# Patient Record
Sex: Male | Born: 1965 | Race: White | Hispanic: No | Marital: Single | Smoking: Former smoker
Health system: Southern US, Community
[De-identification: ages and names within clinical notes are randomized; demographics above are authoritative.]

## PROBLEM LIST (undated history)

## (undated) ENCOUNTER — Emergency Department (HOSPITAL_COMMUNITY): Admission: EM | Payer: Self-pay | Source: Home / Self Care

## (undated) DIAGNOSIS — E119 Type 2 diabetes mellitus without complications: Secondary | ICD-10-CM

---

## 2015-09-22 DIAGNOSIS — F3342 Major depressive disorder, recurrent, in full remission: Secondary | ICD-10-CM

## 2015-09-22 DIAGNOSIS — E782 Mixed hyperlipidemia: Secondary | ICD-10-CM

## 2016-01-14 DIAGNOSIS — N529 Male erectile dysfunction, unspecified: Secondary | ICD-10-CM | POA: Insufficient documentation

## 2017-07-13 DIAGNOSIS — E876 Hypokalemia: Secondary | ICD-10-CM

## 2017-07-13 DIAGNOSIS — K859 Acute pancreatitis without necrosis or infection, unspecified: Secondary | ICD-10-CM

## 2017-07-13 DIAGNOSIS — E1311 Other specified diabetes mellitus with ketoacidosis with coma: Secondary | ICD-10-CM

## 2017-07-13 DIAGNOSIS — N179 Acute kidney failure, unspecified: Secondary | ICD-10-CM

## 2017-07-13 DIAGNOSIS — R748 Abnormal levels of other serum enzymes: Secondary | ICD-10-CM

## 2017-07-13 DIAGNOSIS — R6511 Systemic inflammatory response syndrome (SIRS) of non-infectious origin with acute organ dysfunction: Secondary | ICD-10-CM

## 2017-07-13 DIAGNOSIS — G9341 Metabolic encephalopathy: Secondary | ICD-10-CM

## 2017-07-13 DIAGNOSIS — I10 Essential (primary) hypertension: Secondary | ICD-10-CM

## 2018-06-21 ENCOUNTER — Other Ambulatory Visit: Payer: Self-pay

## 2018-06-21 ENCOUNTER — Emergency Department (HOSPITAL_COMMUNITY)
Admission: EM | Admit: 2018-06-21 | Discharge: 2018-06-22 | Disposition: A | Discharge status: Home / Self Care | Payer: No Typology Code available for payment source | Attending: Emergency Medicine | Admitting: Emergency Medicine

## 2018-06-21 ENCOUNTER — Emergency Department (HOSPITAL_COMMUNITY): Payer: No Typology Code available for payment source

## 2018-06-21 ENCOUNTER — Encounter (HOSPITAL_COMMUNITY): Payer: Self-pay | Admitting: Emergency Medicine

## 2018-06-21 DIAGNOSIS — Y990 Civilian activity done for income or pay: Secondary | ICD-10-CM | POA: Insufficient documentation

## 2018-06-21 DIAGNOSIS — Y929 Unspecified place or not applicable: Secondary | ICD-10-CM | POA: Diagnosis not present

## 2018-06-21 DIAGNOSIS — W503XXA Accidental bite by another person, initial encounter: Secondary | ICD-10-CM

## 2018-06-21 DIAGNOSIS — S62667A Nondisplaced fracture of distal phalanx of left little finger, initial encounter for closed fracture: Secondary | ICD-10-CM | POA: Diagnosis present

## 2018-06-21 DIAGNOSIS — E119 Type 2 diabetes mellitus without complications: Secondary | ICD-10-CM | POA: Insufficient documentation

## 2018-06-21 DIAGNOSIS — Y939 Activity, unspecified: Secondary | ICD-10-CM | POA: Diagnosis not present

## 2018-06-21 DIAGNOSIS — Z23 Encounter for immunization: Secondary | ICD-10-CM | POA: Insufficient documentation

## 2018-06-21 DIAGNOSIS — S6992XA Unspecified injury of left wrist, hand and finger(s), initial encounter: Secondary | ICD-10-CM

## 2018-06-21 HISTORY — DX: Type 2 diabetes mellitus without complications: E11.9

## 2018-06-21 LAB — CBG MONITORING, ED: Glucose-Capillary: 80 mg/dL (ref 70–99)

## 2018-06-21 MED ORDER — IBUPROFEN 400 MG PO TABS
600.0000 mg | ORAL_TABLET | Freq: Once | ORAL | Status: AC
  Administered 2018-06-21: 600 mg via ORAL
  Filled 2018-06-21: qty 1

## 2018-06-21 MED ORDER — AMOXICILLIN-POT CLAVULANATE 875-125 MG PO TABS
1.0000 | ORAL_TABLET | Freq: Once | ORAL | Status: AC
  Administered 2018-06-21: 1 via ORAL
  Filled 2018-06-21: qty 1

## 2018-06-21 MED ORDER — TETANUS-DIPHTH-ACELL PERTUSSIS 5-2.5-18.5 LF-MCG/0.5 IM SUSP
0.5000 mL | Freq: Once | INTRAMUSCULAR | Status: AC
  Administered 2018-06-21: 0.5 mL via INTRAMUSCULAR
  Filled 2018-06-21: qty 0.5
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
## 2018-06-22 LAB — COMPREHENSIVE METABOLIC PANEL
ALT: 18 U/L (ref 0–44)
AST: 17 U/L (ref 15–41)
Albumin: 4.1 g/dL (ref 3.5–5.0)
Alkaline Phosphatase: 72 U/L (ref 38–126)
Anion gap: 9 (ref 5–15)
BILIRUBIN TOTAL: 1.2 mg/dL (ref 0.3–1.2)
BUN: 11 mg/dL (ref 6–20)
CO2: 29 mmol/L (ref 22–32)
Calcium: 8.9 mg/dL (ref 8.9–10.3)
Chloride: 100 mmol/L (ref 98–111)
Creatinine, Ser: 0.83 mg/dL (ref 0.61–1.24)
GFR calc Af Amer: >60 mL/min (ref >60)
Glucose, Bld: 162 mg/dL — ABNORMAL HIGH (ref 70–99)
Potassium: 3.3 mmol/L — ABNORMAL LOW (ref 3.5–5.1)
Sodium: 138 mmol/L (ref 135–145)
TOTAL PROTEIN: 6.8 g/dL (ref 6.5–8.1)

## 2018-06-22 LAB — RAPID HIV SCREEN (HIV 1/2 AB+AG)
HIV 1/2 Antibodies: NONREACTIVE
HIV-1 P24 ANTIGEN - HIV24: NONREACTIVE

## 2018-06-22 MED ORDER — EMTRICITABINE-TENOFOVIR DF 200-300 MG PO TABS: 1.0000 | ORAL_TABLET | Freq: Every day | ORAL | 0 refills | Status: DC

## 2018-06-22 MED ORDER — LIDOCAINE HCL (PF) 1 % IJ SOLN
30.0000 mL | Freq: Once | INTRAMUSCULAR | Status: AC
  Administered 2018-06-22: 30 mL
  Filled 2018-06-22: qty 30

## 2018-06-22 MED ORDER — DOLUTEGRAVIR SODIUM 50 MG PO TABS: 50.0000 mg | ORAL_TABLET | Freq: Every day | ORAL | 0 refills | Status: DC

## 2018-06-22 MED ORDER — IBUPROFEN 600 MG PO TABS: 600.0000 mg | ORAL_TABLET | Freq: Four times a day (QID) | ORAL | 0 refills | Status: DC | PRN

## 2018-06-22 MED ORDER — AMOXICILLIN-POT CLAVULANATE 875-125 MG PO TABS: 1.0000 | ORAL_TABLET | Freq: Two times a day (BID) | ORAL | 0 refills | Status: AC

## 2018-06-22 NOTE — Discharge Instructions (Addendum)
Take all medications as prescribed. If your source of exposure tests negative, you can stop the Tivicay and Truvada. Continue the Augmentin until completed. Call Dr. Shelba FlakeLandau's office to schedule a recheck appointment for early next week. Return here as needed.

## 2018-06-22 NOTE — ED Notes (Signed)
Re-paged Hand Surg. to Yahoo! IncSheri Upstill PA

## 2018-06-23 LAB — HEPATITIS B SURFACE ANTIGEN: Hepatitis B Surface Ag: NEGATIVE

## 2018-11-27 ENCOUNTER — Other Ambulatory Visit: Payer: Self-pay | Admitting: Psychiatric/Mental Health

## 2018-11-27 ENCOUNTER — Other Ambulatory Visit: Payer: Self-pay

## 2018-11-27 ENCOUNTER — Encounter (HOSPITAL_COMMUNITY): Payer: Self-pay

## 2018-11-27 ENCOUNTER — Inpatient Hospital Stay (HOSPITAL_COMMUNITY)
Admission: AD | Admit: 2018-11-27 | Discharge: 2018-11-30 | DRG: 881 | Disposition: A | Discharge status: Home / Self Care | Payer: No Typology Code available for payment source | Attending: Psychiatry | Admitting: Psychiatry

## 2018-11-27 DIAGNOSIS — E1165 Type 2 diabetes mellitus with hyperglycemia: Secondary | ICD-10-CM | POA: Diagnosis present

## 2018-11-27 DIAGNOSIS — F1020 Alcohol dependence, uncomplicated: Secondary | ICD-10-CM | POA: Diagnosis present

## 2018-11-27 DIAGNOSIS — G4700 Insomnia, unspecified: Secondary | ICD-10-CM | POA: Diagnosis present

## 2018-11-27 DIAGNOSIS — F322 Major depressive disorder, single episode, severe without psychotic features: Secondary | ICD-10-CM

## 2018-11-27 DIAGNOSIS — F17200 Nicotine dependence, unspecified, uncomplicated: Secondary | ICD-10-CM | POA: Diagnosis present

## 2018-11-27 DIAGNOSIS — F10229 Alcohol dependence with intoxication, unspecified: Secondary | ICD-10-CM | POA: Diagnosis present

## 2018-11-27 DIAGNOSIS — Z794 Long term (current) use of insulin: Secondary | ICD-10-CM | POA: Diagnosis not present

## 2018-11-27 DIAGNOSIS — F329 Major depressive disorder, single episode, unspecified: Principal | ICD-10-CM | POA: Diagnosis present

## 2018-11-27 DIAGNOSIS — F419 Anxiety disorder, unspecified: Secondary | ICD-10-CM | POA: Diagnosis present

## 2018-11-27 HISTORY — DX: Major depressive disorder, single episode, severe without psychotic features: F32.2

## 2018-11-27 LAB — GLUCOSE, CAPILLARY
Glucose-Capillary: 160 mg/dL — ABNORMAL HIGH (ref 70–99)
Glucose-Capillary: 228 mg/dL — ABNORMAL HIGH (ref 70–99)

## 2018-11-27 MED ORDER — INSULIN ASPART 100 UNIT/ML ~~LOC~~ SOLN
0.0000 [IU] | Freq: Three times a day (TID) | SUBCUTANEOUS | Status: DC
  Administered 2018-11-28: 3 [IU] via SUBCUTANEOUS
  Administered 2018-11-28: 1 [IU] via SUBCUTANEOUS
  Administered 2018-11-28: 7 [IU] via SUBCUTANEOUS
  Administered 2018-11-29: 3 [IU] via SUBCUTANEOUS
  Administered 2018-11-29: 9 [IU] via SUBCUTANEOUS
  Administered 2018-11-30: 1 [IU] via SUBCUTANEOUS
  Administered 2018-11-30: 2 [IU] via SUBCUTANEOUS

## 2018-11-27 MED ORDER — METFORMIN HCL 500 MG PO TABS
1000.0000 mg | ORAL_TABLET | Freq: Two times a day (BID) | ORAL | Status: DC
  Administered 2018-11-27 – 2018-11-30 (×5): 1000 mg via ORAL
  Filled 2018-11-27 (×9): qty 2
  Filled 2018-11-27: qty 28
  Filled 2018-11-27 (×2): qty 2
  Filled 2018-11-27: qty 28
  Filled 2018-11-27: qty 2

## 2018-11-27 MED ORDER — CHLORDIAZEPOXIDE HCL 25 MG PO CAPS
25.0000 mg | ORAL_CAPSULE | Freq: Four times a day (QID) | ORAL | Status: DC | PRN
  Administered 2018-11-28: 25 mg via ORAL
  Filled 2018-11-27: qty 1

## 2018-11-27 MED ORDER — INSULIN GLARGINE 100 UNIT/ML ~~LOC~~ SOLN
25.0000 [IU] | Freq: Every day | SUBCUTANEOUS | Status: DC
  Administered 2018-11-27 – 2018-11-28 (×2): 25 [IU] via SUBCUTANEOUS

## 2018-11-27 NOTE — Progress Notes (Signed)
Patient did not attend wrap up group. 

## 2018-11-27 NOTE — Tx Team (Signed)
Initial Treatment Plan 11/27/2018 5:54 PM [name] NMM:[id number]    PATIENT STRESSORS: Financial difficulties Health problems Legal issue Substance abuse   PATIENT STRENGTHS: Ability for insight Capable of independent living Communication skills   PATIENT IDENTIFIED PROBLEMS: "get rid of my depression"  "get job back"  Depression  Suicidal Ideation   Alcohol Abuse             DISCHARGE CRITERIA:  Ability to meet basic life and health needs Adequate post-discharge living arrangements Motivation to continue treatment in a less acute level of care  PRELIMINARY DISCHARGE PLAN: Attend aftercare/continuing care group Attend 12-step recovery group Outpatient therapy  PATIENT/FAMILY INVOLVEMENT: This treatment plan has been presented to and reviewed with the patient, Kenneth Herrera, and/or family member.  The patient and family have been given the opportunity to ask questions and make suggestions.  Clarene Critchley, RN 11/27/2018, 5:54 PM
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
## 2018-11-27 NOTE — Progress Notes (Signed)
Patient ID: Kenneth Herrera, male   DOB: 11-07-1965, 53 y.o.   MRN: [id number]   D: Patient lying in bed on approach tonight. Reports he is just feeling tired. No active SI tonight. Denies having any withdrawal symptoms at this time but does have Librium available if needed. CBG's will be assessed AC & HS. A: Staff will monitor on q 15 minute checks, follow treatment plan, and give medications as ordered. R: Cooperative tonight but very sleepy.

## 2018-11-27 NOTE — Progress Notes (Signed)
Admission Note: Patient is a 53 year old male admitted to the unit under IVC for making suicidal threat and drinking all day.  Patient currently denies suicidal ideation while in the hospital.  Patient appears disorganized and intoxicated.  Speech is slurred with unsteady gait.  States goals for hospitalization is to get rid of depression and to get his job back.  Admission plan of care reviewed and consent signed.  Skin assessment completed.  Skin is dry and intact.  No contraband found.  Patient oriented to the unit, staff and room.  Routine safety checks initiated.  Support and encouragement offered.  Patient is safe on the unit.

## 2018-11-28 DIAGNOSIS — F322 Major depressive disorder, single episode, severe without psychotic features: Secondary | ICD-10-CM

## 2018-11-28 DIAGNOSIS — F1020 Alcohol dependence, uncomplicated: Secondary | ICD-10-CM

## 2018-11-28 HISTORY — DX: Alcohol dependence, uncomplicated: F10.20

## 2018-11-28 LAB — GLUCOSE, CAPILLARY
Glucose-Capillary: 146 mg/dL — ABNORMAL HIGH (ref 70–99)
Glucose-Capillary: 216 mg/dL — ABNORMAL HIGH (ref 70–99)
Glucose-Capillary: 344 mg/dL — ABNORMAL HIGH (ref 70–99)
Glucose-Capillary: 326 mg/dL — ABNORMAL HIGH (ref 70–99)

## 2018-11-28 LAB — HEMOGLOBIN A1C
Hgb A1c MFr Bld: 11.8 % — ABNORMAL HIGH (ref 4.8–5.6)
Mean Plasma Glucose: 291.96 mg/dL

## 2018-11-28 MED ORDER — GABAPENTIN 300 MG PO CAPS
300.0000 mg | ORAL_CAPSULE | Freq: Three times a day (TID) | ORAL | Status: DC
  Administered 2018-11-28 – 2018-11-30 (×8): 300 mg via ORAL
  Filled 2018-11-28 (×5): qty 1
  Filled 2018-11-28 (×2): qty 42
  Filled 2018-11-28 (×5): qty 1
  Filled 2018-11-28: qty 42
  Filled 2018-11-28 (×3): qty 1

## 2018-11-28 MED ORDER — VITAMIN B-1 100 MG PO TABS
100.0000 mg | ORAL_TABLET | Freq: Every day | ORAL | Status: DC
  Administered 2018-11-28 – 2018-11-30 (×3): 100 mg via ORAL
  Filled 2018-11-28 (×8): qty 1

## 2018-11-28 MED ORDER — FOLIC ACID 1 MG PO TABS
1.0000 mg | ORAL_TABLET | Freq: Every day | ORAL | Status: DC
  Administered 2018-11-28 – 2018-11-30 (×3): 1 mg via ORAL
  Filled 2018-11-28 (×7): qty 1

## 2018-11-28 MED ORDER — ALUM & MAG HYDROXIDE-SIMETH 200-200-20 MG/5ML PO SUSP: 30.0000 mL | ORAL | Status: DC | PRN

## 2018-11-28 MED ORDER — ACETAMINOPHEN 325 MG PO TABS: 650.0000 mg | ORAL_TABLET | Freq: Four times a day (QID) | ORAL | Status: DC | PRN

## 2018-11-28 MED ORDER — HYDROXYZINE HCL 25 MG PO TABS: 25.0000 mg | ORAL_TABLET | Freq: Three times a day (TID) | ORAL | Status: DC | PRN

## 2018-11-28 MED ORDER — MAGNESIUM HYDROXIDE 400 MG/5ML PO SUSP: 30.0000 mL | Freq: Every day | ORAL | Status: DC | PRN

## 2018-11-28 MED ORDER — TRAZODONE HCL 50 MG PO TABS
50.0000 mg | ORAL_TABLET | Freq: Every evening | ORAL | Status: DC | PRN
  Administered 2018-11-28 – 2018-11-29 (×2): 50 mg via ORAL
  Filled 2018-11-28 (×2): qty 1
  Filled 2018-11-28: qty 14

## 2018-11-28 MED ORDER — NICOTINE 21 MG/24HR TD PT24
21.0000 mg | MEDICATED_PATCH | Freq: Every day | TRANSDERMAL | Status: DC
  Administered 2018-11-28: 21 mg via TRANSDERMAL
  Filled 2018-11-28 (×6): qty 1
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
## 2018-11-28 NOTE — Progress Notes (Signed)
Patient ID: Kenneth Herrera, male   DOB: 05/04/1966, 53 y.o.   MRN: [id number]  [place] NOVEL CORONAVIRUS (COVID-19) DAILY CHECK-OFF SYMPTOMS - answer yes or no to each - every day NO YES  Have you had a fever in the past 24 hours?  . Fever (Temp > 37.80C / 100F) X   Have you had any of these symptoms in the past 24 hours? . New Cough .  Sore Throat  .  Shortness of Breath .  Difficulty Breathing .  Unexplained Body Aches   X   Have you had any one of these symptoms in the past 24 hours not related to allergies?   . Runny Nose .  Nasal Congestion .  Sneezing   X   If you have had runny nose, nasal congestion, sneezing in the past 24 hours, has it worsened?  X   EXPOSURES - check yes or no X   Have you traveled outside the state in the past 14 days?  X   Have you been in contact with someone with a confirmed diagnosis of COVID-19 or PUI in the past 14 days without wearing appropriate PPE?  X   Have you been living in the same home as a person with confirmed diagnosis of COVID-19 or a PUI (household contact)?    X   Have you been diagnosed with COVID-19?    X              What to do next: Answered NO to all: Answered YES to anything:   Proceed with unit schedule Follow the [facility] Inpatient Flowsheet.

## 2018-11-28 NOTE — H&P (Signed)
Psychiatric Admission Assessment Adult  Patient Identification: Kenneth Herrera MRN:  [id number] Date of Evaluation:  11/28/2018 Chief Complaint:  "I made some mistakes while I was drunk." Principal Diagnosis: MDD (major depressive disorder), single episode, severe , no psychosis (HCC) Diagnosis:  Principal Problem:   MDD (major depressive disorder), single episode, severe , no psychosis (HCC) Active Problems:   Alcohol use disorder, severe, dependence (HCC)  History of Present Illness: From MD's admission SRA: Patient is a 53 year old male with a past psychiatric history significant for alcohol use disorder who was brought to [facility] under involuntary commitment.  Because of intoxication and threats to harm himself.  The patient stated that he had broken up with a girlfriend recently, and it started drinking again.  He apparently had had 2-1/2 years of sobriety.  He stated he got upset, and then was intoxicated.  While he was intoxicated he attempted to retrieve his AR 15 rifle from his bedroom.  He also threatened police officers.  He stated he would kill himself, but luckily the weapon was not loaded.  It says in the notes that the patient had a history of inpatient treatment for depression, but the patient denied that to me today.  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  He denied any other substances besides alcohol.  There was mention in the notes from [facility] that the patient had a history of polysubstance use disorder, but his drug screen from [place] was completely negative.  He currently denied suicidal ideation.  He currently denied any helplessness, hopelessness or worthlessness.  Review of his laboratories showed a normal CBC including a normal MCV.  His electrolytes were all essentially normal as well except for an elevated blood sugar at 188.  His liver function enzymes were normal.  His blood alcohol in the emergency department at  [facility] was 0.22.  He was admitted to the hospital for evaluation and stabilization.  Associated Signs/Symptoms: Depression Symptoms:  Denies history of depression, hopelessness, sleep/energy/appetite changes or SI while sober. Made suicidal statements to police officers while drunk. (Hypo) Manic Symptoms:  denies Anxiety Symptoms:  denies Psychotic Symptoms:  denies PTSD Symptoms: Negative Total Time spent with patient: 30 minutes  Past Psychiatric History: History of alcohol use disorder with reported sobriety for 2.5 years until relapse two days ago. Denies history of any other drug use. Denies prior hospitalizations for mental health/substance use or rehab. Denies history of mania or psychosis. He reports his PCP prescribed a medication for anxiety one year ago but he stopped after one month.  Is the patient at risk to self? Yes.    Has the patient been a risk to self in the past 6 months? No.  Has the patient been a risk to self within the distant past? No.  Is the patient a risk to others? No.  Has the patient been a risk to others in the past 6 months? No.  Has the patient been a risk to others within the distant past? No.   Prior Inpatient Therapy: Prior Inpatient Therapy: No Prior Outpatient Therapy: Prior Outpatient Therapy: No Does patient have an ACCT team?: No Does patient have Intensive In-House Services?  : No Does patient have [facility] services? : No Does patient have P4CC services?: No  Alcohol Screening: 1. How often do you have a drink containing alcohol?: Never 2. How many drinks containing alcohol do you have on a typical day when you are drinking?: 1  or 2 3. How often do you have six or more drinks on one occasion?: Never AUDIT-C Score: 0 4. How often during the last year have you found that you were not able to stop drinking once you had started?: Never 5. How often during the last year have you failed to do what was normally expected from you becasue of  drinking?: Never 6. How often during the last year have you needed a first drink in the morning to get yourself going after a heavy drinking session?: Never 7. How often during the last year have you had a feeling of guilt of remorse after drinking?: Never 8. How often during the last year have you been unable to remember what happened the night before because you had been drinking?: Never 9. Have you or someone else been injured as a result of your drinking?: No 10. Has a relative or friend or a doctor or another health worker been concerned about your drinking or suggested you cut down?: No Alcohol Use Disorder Identification Test Final Score (AUDIT): 0 Substance Abuse History in the last 12 months:  Yes.   Consequences of Substance Abuse: Legal Consequences:  DUIs Previous Psychotropic Medications: Yes PCP prescribed medication for anxiety one year ago. Patient cannot recall name of medication and only took it for one month, did not feel it was helpful. Psychological Evaluations: No  Past Medical History:  Past Medical History:  Diagnosis Date  . Diabetes mellitus without complication (HCC)    History reviewed. No pertinent surgical history. Family History: History reviewed. No pertinent family history. Family Psychiatric  History: Denies Tobacco Screening: Have you used any form of tobacco in the last 30 days? (Cigarettes, Smokeless Tobacco, Cigars, and/or Pipes): Yes Tobacco use, Select all that apply: smokeless tobacco use daily Are you interested in Tobacco Cessation Medications?: No, patient refused Counseled patient on smoking cessation including recognizing danger situations, developing coping skills and basic information about quitting provided: Yes Social History:  Social History   Substance and Sexual Activity  Alcohol Use Not on file     Social History   Substance and Sexual Activity  Drug Use Not on file    Additional Social History: Marital status: Single    Pain  Medications: see MAR Prescriptions: see MAR Over the Counter: see MAR History of alcohol / drug use?: No history of alcohol / drug abuse                    Allergies:  No Known Allergies Lab Results:  Results for orders placed or performed during the hospital encounter of 11/27/18 (from the past 48 hour(s))  Glucose, capillary     Status: Abnormal   Collection Time: 11/27/18  5:47 PM  Result Value Ref Range   Glucose-Capillary 160 (H) 70 - 99 mg/dL  Glucose, capillary     Status: Abnormal   Collection Time: 11/27/18  8:11 PM  Result Value Ref Range   Glucose-Capillary 228 (H) 70 - 99 mg/dL  Glucose, capillary     Status: Abnormal   Collection Time: 11/28/18  5:50 AM  Result Value Ref Range   Glucose-Capillary 146 (H) 70 - 99 mg/dL  Glucose, capillary     Status: Abnormal   Collection Time: 11/28/18 11:54 AM  Result Value Ref Range   Glucose-Capillary 216 (H) 70 - 99 mg/dL    Blood Alcohol level:  No results found for: [facility]  Metabolic Disorder Labs:  No results found for: HGBA1C, MPG No  results found for: PROLACTIN No results found for: CHOL, TRIG, HDL, CHOLHDL, VLDL, LDLCALC  Current Medications: Current Facility-Administered Medications  Medication Dose Route Frequency Provider Last Rate Last Dose  . acetaminophen (TYLENOL) tablet 650 mg  650 mg Oral Q6H PRN Antonieta Pert, MD      . alum & mag hydroxide-simeth (MAALOX/MYLANTA) 200-200-20 MG/5ML suspension 30 mL  30 mL Oral Q4H PRN Antonieta Pert, MD      . chlordiazePOXIDE (LIBRIUM) capsule 25 mg  25 mg Oral Q6H PRN Antonieta Pert, MD   25 mg at 11/28/18 1212  . folic acid (FOLVITE) tablet 1 mg  1 mg Oral Daily Antonieta Pert, MD   1 mg at 11/28/18 1010  . gabapentin (NEURONTIN) capsule 300 mg  300 mg Oral TID Antonieta Pert, MD   300 mg at 11/28/18 1212  . hydrOXYzine (ATARAX/VISTARIL) tablet 25 mg  25 mg Oral TID PRN Antonieta Pert, MD      . insulin aspart (novoLOG) injection 0-9 Units   0-9 Units Subcutaneous TID WC Antonieta Pert, MD   3 Units at 11/28/18 1209  . insulin glargine (LANTUS) injection 25 Units  25 Units Subcutaneous QHS Antonieta Pert, MD   25 Units at 11/27/18 2122  . magnesium hydroxide (MILK OF MAGNESIA) suspension 30 mL  30 mL Oral Daily PRN Antonieta Pert, MD      . metFORMIN (GLUCOPHAGE) tablet 1,000 mg  1,000 mg Oral BID WC Antonieta Pert, MD   1,000 mg at 11/28/18 1011  . thiamine (VITAMIN B-1) tablet 100 mg  100 mg Oral Daily Antonieta Pert, MD   100 mg at 11/28/18 1011  . traZODone (DESYREL) tablet 50 mg  50 mg Oral QHS PRN Antonieta Pert, MD       PTA Medications: Medications Prior to Admission  Medication Sig Dispense Refill Last Dose  . pioglitazone (ACTOS) 15 MG tablet Take 15 mg by mouth daily.     Marland Kitchen atorvastatin (LIPITOR) 20 MG tablet Take 20 mg by mouth daily.   06/21/2018 at Unknown time  . Dulaglutide 1.5 MG/0.5ML SOPN Inject 1.5 mg into the skin once a week. On Tuesday   Past Month at Unknown time  . gabapentin (NEURONTIN) 300 MG capsule Take 300 mg by mouth 3 (three) times daily.   06/21/2018 at Unknown time  . insulin glargine (LANTUS) 100 UNIT/ML injection Inject 25 Units into the skin daily.   06/21/2018 at Unknown time  . metFORMIN (GLUCOPHAGE) 1000 MG tablet Take 1 tablet by mouth 2 (two) times a day.       Musculoskeletal: Strength & Muscle Tone: within normal limits Gait & Station: normal Patient leans: N/A  Psychiatric Specialty Exam: Physical Exam  Nursing note and vitals reviewed. Constitutional: He is oriented to person, place, and time. He appears well-developed and well-nourished.  Cardiovascular: Normal rate.  Respiratory: Effort normal.  Neurological: He is alert and oriented to person, place, and time.    Review of Systems  Constitutional: Negative.   Respiratory: Negative for cough and shortness of breath.   Cardiovascular: Negative for chest pain.  Gastrointestinal: Negative for nausea  and vomiting.  Neurological: Negative for tremors and headaches.  Psychiatric/Behavioral: Positive for substance abuse (ETOH). Negative for depression, hallucinations and suicidal ideas. The patient is not nervous/anxious and does not have insomnia.     Blood pressure (!) 73/63, pulse 73, temperature 97.7 F (36.5 C), temperature source Oral, resp. rate 18, height  5\' 8"  (1.727 m), weight 74.8 kg, SpO2 97 %.Body mass index is 25.09 kg/m.  See MD's admission SRA    Treatment Plan Summary: Daily contact with patient to assess and evaluate symptoms and progress in treatment and Medication management   Inpatient hospitalization.  See MD's admission SRA for medication management.  Patient will participate in the therapeutic group milieu.  Discharge disposition in progress.   Observation Level/Precautions:  15 minute checks  Laboratory:  HbAIC  Psychotherapy:  Group therapy  Medications:  See MAR  Consultations:  PRN  Discharge Concerns:  Safety and stabilization  Estimated LOS: 3-5 days  Other:     Physician Treatment Plan for Primary Diagnosis: MDD (major depressive disorder), single episode, severe , no psychosis (HCC) Long Term Goal(s): Improvement in symptoms so as ready for discharge  Short Term Goals: Ability to identify changes in lifestyle to reduce recurrence of condition will improve, Ability to verbalize feelings will improve and Ability to disclose and discuss suicidal ideas  Physician Treatment Plan for Secondary Diagnosis: Principal Problem:   MDD (major depressive disorder), single episode, severe , no psychosis (HCC) Active Problems:   Alcohol use disorder, severe, dependence (HCC)  Long Term Goal(s): Improvement in symptoms so as ready for discharge  Short Term Goals: Ability to demonstrate self-control will improve and Ability to identify and develop effective coping behaviors will improve  I certify that inpatient services furnished can reasonably be expected  to improve the patient's condition.    Aldean Baker, NP 5/6/20201:26 PM

## 2018-11-28 NOTE — Plan of Care (Signed)
Nursing Progress Note [id number]  On initial approach, patient is seen up in the milieu. Patient appears hyperactivity, fidgety and restless. Patient presents cooperative but with tangential speech. Patient reports he believes he has arrested another peer on the unit today and was paranoid this morning about his presence, however peer discharged. Patient seen up in the milieu. Patient minimizes alcohol use and adamantly denies withdrawal, however symptoms do appear to improve after receiving PRN Librium. Patient compliant with scheduled medications. Patient is seen attending groups and visible in the milieu. Patient currently denies SI/HI/AVH.   Patient is educated about and provided medication per provider's orders. Patient safety maintained with q15 min safety checks and high  fall risk precautions. Emotional support given, 1:1 interaction, and active listening provided. Patient encouraged to attend meals, groups, and work on treatment plan and goals. Labs, vital signs and patient behavior monitored throughout shift.   Patient contracts for safety with staff. Patient remains safe on the unit at this time and agrees to come to staff with any issues/concerns. Patient is interacting with peers appropriately on the unit. Will continue to support and monitor.   Patient's self-inventory sheet Rated Energy Level  High  Rated Sleep  Good  Rated Appetite  Good  Rated Anxiety (0-10)  0  Rated Hopelessness (0-10)  0  Rated Depression (0-10)  0  Daily Goal  "getting out and seeing my daughter & taking care of some business"   Any Additional Comments:      Problem: Education: Goal: Knowledge of [place] General Education information/materials will improve Outcome: Progressing   Problem: Medication: Goal: Compliance with prescribed medication regimen will improve Outcome: Progressing

## 2018-11-28 NOTE — Progress Notes (Signed)
Pt has been hyperactive, fidgety, but cooperative with tangential speech. Pt reports that he had a good day and rated it a "10" but reports that he didn't have a goal for the day. Pt reports that he does want to go to groups and participate. Pt given trazodone to help him sleep, Pt denies SI/HI or hallucinations (a) 15 min checks (r) safety maintained.

## 2018-11-28 NOTE — Progress Notes (Signed)
Recreation Therapy Notes  Date:  5.6.20 Time: 0930 Location: 300 Hall Dayroom  Group Topic: Stress Management  Goal Area(s) Addresses:  Patient will identify positive stress management techniques. Patient will identify benefits of using stress management post d/c.  Intervention: Stress Management  Activity :  Guided Imagery.  LRT introduced the stress management technique of guided imagery.  LRT read a script that guided patients through a peaceful meadow.  Patients were to listen and follow along as script was read to engage in activity.  Education:  Stress Management, Discharge Planning.   Education Outcome: Acknowledges Education  Clinical Observations/Feedback: Pt did not attend group.    Caroll Rancher, LRT/CTRS         Caroll Rancher A 11/28/2018 11:35 AM

## 2018-11-28 NOTE — Tx Team (Signed)
Interdisciplinary Treatment and Diagnostic Plan Update  11/28/2018 Time of Session: 9:00am Kenneth Herrera MRN: [id number]  Principal Diagnosis: <principal problem not specified>  Secondary Diagnoses: Active Problems:   MDD (major depressive disorder), recurrent, severe, with psychosis ([place])   Alcohol use disorder, severe, dependence ([place])   Current Medications:  Current Facility-Administered Medications  Medication Dose Route Frequency Provider Last Rate Last Dose  . acetaminophen (TYLENOL) tablet 650 mg  650 mg Oral Q6H PRN Sharma Covert, MD      . alum & mag hydroxide-simeth (MAALOX/MYLANTA) 200-200-20 MG/5ML suspension 30 mL  30 mL Oral Q4H PRN Sharma Covert, MD      . chlordiazePOXIDE (LIBRIUM) capsule 25 mg  25 mg Oral Q6H PRN Sharma Covert, MD      . folic acid (FOLVITE) tablet 1 mg  1 mg Oral Daily Sharma Covert, MD      . gabapentin (NEURONTIN) capsule 300 mg  300 mg Oral TID Sharma Covert, MD      . hydrOXYzine (ATARAX/VISTARIL) tablet 25 mg  25 mg Oral TID PRN Sharma Covert, MD      . insulin aspart (novoLOG) injection 0-9 Units  0-9 Units Subcutaneous TID WC Sharma Covert, MD   1 Units at 11/28/18 [phone]  . insulin glargine (LANTUS) injection 25 Units  25 Units Subcutaneous QHS Sharma Covert, MD   25 Units at 11/27/18 2122  . magnesium hydroxide (MILK OF MAGNESIA) suspension 30 mL  30 mL Oral Daily PRN Sharma Covert, MD      . metFORMIN (GLUCOPHAGE) tablet 1,000 mg  1,000 mg Oral BID WC Sharma Covert, MD   1,000 mg at 11/27/18 2122  . thiamine (VITAMIN B-1) tablet 100 mg  100 mg Oral Daily Sharma Covert, MD      . traZODone (DESYREL) tablet 50 mg  50 mg Oral QHS PRN Sharma Covert, MD       PTA Medications: Medications Prior to Admission  Medication Sig Dispense Refill Last Dose  . pioglitazone (ACTOS) 15 MG tablet Take 15 mg by mouth daily.     Marland Kitchen atorvastatin (LIPITOR) 20 MG tablet Take 20 mg by mouth daily.    06/21/2018 at Unknown time  . Dulaglutide 1.5 MG/0.5ML SOPN Inject 1.5 mg into the skin once a week. On Tuesday   Past Month at Unknown time  . gabapentin (NEURONTIN) 300 MG capsule Take 300 mg by mouth 3 (three) times daily.   06/21/2018 at Unknown time  . insulin glargine (LANTUS) 100 UNIT/ML injection Inject 25 Units into the skin daily.   06/21/2018 at Unknown time  . metFORMIN (GLUCOPHAGE) 1000 MG tablet Take 1 tablet by mouth 2 (two) times a day.       Patient Stressors: Financial difficulties Health problems Legal issue Substance abuse  Patient Strengths: Ability for insight Capable of independent living Communication skills  Treatment Modalities: Medication Management, Group therapy, Case management,  1 to 1 session with clinician, Psychoeducation, Recreational therapy.   Physician Treatment Plan for Primary Diagnosis: <principal problem not specified> Long Term Goal(s):     Short Term Goals:    Medication Management: Evaluate patient's response, side effects, and tolerance of medication regimen.  Therapeutic Interventions: 1 to 1 sessions, Unit Group sessions and Medication administration.  Evaluation of Outcomes: Not Met  Physician Treatment Plan for Secondary Diagnosis: Active Problems:   MDD (major depressive disorder), recurrent, severe, with psychosis ([place])   Alcohol use disorder, severe, dependence (  [place])  Long Term Goal(s):     Short Term Goals:       Medication Management: Evaluate patient's response, side effects, and tolerance of medication regimen.  Therapeutic Interventions: 1 to 1 sessions, Unit Group sessions and Medication administration.  Evaluation of Outcomes: Not Met   RN Treatment Plan for Primary Diagnosis: <principal problem not specified> Long Term Goal(s): Knowledge of disease and therapeutic regimen to maintain health will improve  Short Term Goals: Ability to verbalize frustration and anger appropriately will improve, Ability to  identify and develop effective coping behaviors will improve and Compliance with prescribed medications will improve  Medication Management: RN will administer medications as ordered by provider, will assess and evaluate patient's response and provide education to patient for prescribed medication. RN will report any adverse and/or side effects to prescribing provider.  Therapeutic Interventions: 1 on 1 counseling sessions, Psychoeducation, Medication administration, Evaluate responses to treatment, Monitor vital signs and CBGs as ordered, Perform/monitor CIWA, COWS, AIMS and Fall Risk screenings as ordered, Perform wound care treatments as ordered.  Evaluation of Outcomes: Not Met   LCSW Treatment Plan for Primary Diagnosis: <principal problem not specified> Long Term Goal(s): Safe transition to appropriate next level of care at discharge, Engage patient in therapeutic group addressing interpersonal concerns.  Short Term Goals: Engage patient in aftercare planning with referrals and resources, Increase social support, Identify triggers associated with mental health/substance abuse issues and Increase skills for wellness and recovery  Therapeutic Interventions: Assess for all discharge needs, 1 to 1 time with Social worker, Explore available resources and support systems, Assess for adequacy in community support network, Educate family and significant other(s) on suicide prevention, Complete Psychosocial Assessment, Interpersonal group therapy.  Evaluation of Outcomes: Not Met   Progress in Treatment: Attending groups: No. New to unit. Participating in groups: No. Taking medication as prescribed: Yes. Toleration medication: Yes. Family/Significant other contact made: No, will contact:  supports if consents are granted Patient understands diagnosis: No. Discussing patient identified problems/goals with staff: Yes. Medical problems stabilized or resolved: Yes. Denies suicidal/homicidal  ideation: Yes. Issues/concerns per patient self-inventory: No.   New problem(s) identified: Yes, Describe:  Firearms in home.  New Short Term/Long Term Goal(s): detox, medication management for mood stabilization; elimination of SI thoughts; development of comprehensive mental wellness/sobriety plan.  Patient Goals:  "Get out of here, go to work."  Discharge Plan or Barriers: .CSW continuing to assess for appropriate referrals.   Reason for Continuation of Hospitalization: Anxiety Depression  Estimated Length of Stay: 3-5 days  Attendees: Patient: Kenneth Herrera 11/28/2018 8:36 AM  Physician: Queen Blossom 11/28/2018 8:36 AM  Nursing:  11/28/2018 8:36 AM  RN Care Manager: 11/28/2018 8:36 AM  Social Worker: Stephanie Acre, Latanya Presser 11/28/2018 8:36 AM  Recreational Therapist:  11/28/2018 8:36 AM  Other:  11/28/2018 8:36 AM  Other:  11/28/2018 8:36 AM  Other: 11/28/2018 8:36 AM    Scribe for Treatment Team: Joellen Jersey, [place] 11/28/2018 8:36 AM

## 2018-11-28 NOTE — [facility] Suicide Risk Assessment (Signed)
[facility] Admission Suicide Risk Assessment   Nursing information obtained from:  Patient Demographic factors:  [facility]  [facility] Loss Factors:  Financial problems / change in socioeconomic status, Legal issues Historical Factors:  Impulsivity Risk Reduction Factors:  Positive social support  Total Time spent with patient: 30 minutes Principal Problem: <principal problem not specified> Diagnosis:  Active Problems:   MDD (major depressive disorder), recurrent, severe, with psychosis ([facility])  Subjective Data: Patient is seen and examined.  Patient is a 53 year old male with a past psychiatric history significant for alcohol use disorder who [facility].  Because of intoxication and threats to harm himself.  The patient stated that he had broken up with a girlfriend recently, and it started drinking again.  He apparently had had 2-1/2 years of sobriety.  He stated he got upset, and then was intoxicated.  While he was intoxicated he attempted to retrieve his AR 15 rifle from his bedroom.  He also threatened police officers.  He stated he would kill himself, but luckily the weapon was not loaded.  It says in the notes that the patient had a history of inpatient treatment for depression, but the patient denied that to me today.  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  He denied any other substances besides alcohol.  There was mention in [facility] that the patient had a history of polysubstance use disorder, but his drug screen [facility] was completely negative.  He currently denied suicidal ideation.  He currently denied any helplessness, hopelessness or worthlessness.  Review of his laboratories showed a normal CBC including a normal MCV.  His electrolytes were all essentially normal as well except for an elevated blood sugar at 188.  His liver function enzymes were normal.  [facility] blood alcohol  [facility].  He was admitted to the hospital for evaluation and stabilization.  Continued Clinical Symptoms:  Alcohol Use Disorder Identification Test Final Score (AUDIT): 0 The "Alcohol Use Disorders Identification Test", Guidelines for Use in Primary Care, Second Edition.  World Science writerHealth Organization [facility](WHO). Score between 0-7:  no or low risk or alcohol related problems. Score between 8-15:  moderate risk of alcohol related problems. Score between 16-19:  high risk of alcohol related problems. Score 20 or above:  warrants further diagnostic evaluation for alcohol dependence and treatment.   CLINICAL FACTORS:   Alcohol/Substance Abuse/Dependencies   Musculoskeletal: Strength & Muscle Tone: within normal limits Gait & Station: normal Patient leans: N/A  Psychiatric Specialty Exam: Physical Exam  Nursing note and vitals reviewed. Constitutional: He is oriented to person, place, and time. He appears well-developed and well-nourished.  HENT:  Head: Normocephalic and atraumatic.  Respiratory: Effort normal.  Neurological: He is alert and oriented to person, place, and time.    ROS  Blood pressure (!) 73/63, pulse 73, temperature 97.7 F (36.5 C), temperature source Oral, resp. rate 18, height 5\' 8"  (1.727 m), weight 74.8 kg, SpO2 97 %.Body mass index is 25.09 kg/m.  General Appearance: Casual  Eye Contact:  Fair  Speech:  Pressured  Volume:  Increased  Mood:  Anxious  Affect:  Congruent  Thought Process:  Coherent and Descriptions of Associations: Circumstantial  Orientation:  Full (Time, Place, and Person)  Thought Content:  Logical  Suicidal Thoughts:  No  Homicidal Thoughts:  No  Memory:  Immediate;   Fair Recent;  Fair Remote;   Fair  Judgement:  Impaired  Insight:  Lacking  Psychomotor Activity:  Increased  Concentration:  Concentration: Fair and Attention Span: Fair  Recall:  [employer] of Knowledge:  Fair  Language:  Fair   Akathisia:  Negative  Handed:  Right  AIMS (if indicated):     Assets:  Desire for Improvement Resilience  ADL's:  Intact  Cognition:  WNL  Sleep:  Number of Hours: 5.25      COGNITIVE FEATURES THAT CONTRIBUTE TO RISK:  None    SUICIDE RISK:   Mild:  Suicidal ideation of limited frequency, intensity, duration, and specificity.  There are no identifiable plans, no associated intent, mild dysphoria and related symptoms, good self-control (both objective and subjective assessment), few other risk factors, and identifiable protective factors, including available and accessible social support.  PLAN OF CARE: Patient is seen and examined.  Patient is a 53 year old male with a probable past psychiatric history significant for alcohol use disorder who presented to the [facility] under involuntary commitment secondary to suicidal ideation and threats to police while intoxicated.  He will be admitted to the hospital.  He will be integrated into the milieu.  He will be encouraged to go to groups.  His diabetic medication will be continued.  He will have available Librium 25 mg p.o. every 6 hours as needed CIWA greater than 10.  He will also receive folic acid as well as thiamine.  We will collect collateral information to confirm his story.  We will find out more information with regard to information in the old chart that suggested polysubstance issues as well as a previous psychiatric admission.  His blood sugar this morning was 146, and we will continue his regimen as currently written.  I certify that inpatient services furnished can reasonably be expected to improve the patient's condition.   Antonieta Pert, MD 11/28/2018, 7:51 AM

## 2018-11-29 LAB — GLUCOSE, CAPILLARY
Glucose-Capillary: 221 mg/dL — ABNORMAL HIGH (ref 70–99)
Glucose-Capillary: 478 mg/dL — ABNORMAL HIGH (ref 70–99)
Glucose-Capillary: 374 mg/dL — ABNORMAL HIGH (ref 70–99)
Glucose-Capillary: 318 mg/dL — ABNORMAL HIGH (ref 70–99)
Glucose-Capillary: 173 mg/dL — ABNORMAL HIGH (ref 70–99)
Glucose-Capillary: 81 mg/dL (ref 70–99)
Glucose-Capillary: 165 mg/dL — ABNORMAL HIGH (ref 70–99)

## 2018-11-29 MED ORDER — OXCARBAZEPINE 150 MG PO TABS
150.0000 mg | ORAL_TABLET | Freq: Two times a day (BID) | ORAL | Status: DC
  Administered 2018-11-29 – 2018-11-30 (×3): 150 mg via ORAL
  Filled 2018-11-29 (×2): qty 1
  Filled 2018-11-29: qty 28
  Filled 2018-11-29 (×3): qty 1
  Filled 2018-11-29: qty 28

## 2018-11-29 MED ORDER — INSULIN ASPART 100 UNIT/ML ~~LOC~~ SOLN
10.0000 [IU] | Freq: Once | SUBCUTANEOUS | Status: AC
  Administered 2018-11-29: 10 [IU] via SUBCUTANEOUS

## 2018-11-29 MED ORDER — INSULIN GLARGINE 100 UNIT/ML ~~LOC~~ SOLN
30.0000 [IU] | Freq: Every day | SUBCUTANEOUS | Status: DC
  Administered 2018-11-29: 30 [IU] via SUBCUTANEOUS

## 2018-11-29 NOTE — Progress Notes (Signed)
D: Pt was at nurse's station upon initial approach.  Pt presents with appropriate affect and mood.  He is talkative with staff and pleasant although he reports his day was "terrible" because "my blood sugar was up and then it was down."  Pt denies SI/HI, denies hallucinations, denies pain.  Pt has been visible in milieu interacting with peers and staff appropriately.    A: Introduced self to pt.  Met with pt 1:1.  Actively listened to pt and offered support and encouragement.  Medication administered per order.  PRN medication administered for sleep.  Q15 minute safety checks maintained.  R: Pt is safe on the unit.  Pt is compliant with medications.  Pt verbally contracts for safety.  Will continue to monitor and assess.

## 2018-11-29 NOTE — [facility] Suicide Risk Assessment (Addendum)
[facility] INPATIENT:  Family/Significant Other Suicide Prevention Education  Suicide Prevention Education:  Education Completed; Karrie Doffing, sister, [phone] has been identified by the patient as the family member/significant other with whom the patient will be residing, and identified as the person(s) who will aid the patient in the event of a mental health crisis (suicidal ideations/suicide attempt).  With written consent from the patient, the family member/significant other has been provided the following suicide prevention education, prior to the and/or following the discharge of the patient.  The suicide prevention education provided includes the following:  Suicide risk factors  Suicide prevention and interventions  National Suicide Hotline telephone number  [facility] assessment telephone number  [facility] Emergency Assistance 911  [facility] and/or Residential Mobile Crisis Unit telephone number  Request made of family/significant other to:  Remove weapons (e.g., guns, rifles, knives), all items previously/currently identified as safety concern.    Remove drugs/medications (over-the-counter, prescriptions, illicit drugs), all items previously/currently identified as a safety concern.  The family member/significant other verbalizes understanding of the suicide prevention education information provided.  The family member/significant other agrees to remove the items of safety concern listed above.  Sister reports that the [facility] department has taken possession of patient's firearms. CSW left a voicemail for [facility] in the evidence department in an attempt to confirm the sheriff has possession of patient's firearms including the AR-15. Update: [facility] called and reported she has 3 firearms and if the patient was IVC'd, he will likely not be able to get back his firearms   Sister states she does not believe patient will  harm himself or anyone else. She reports he sounds like himself on the phone and knows he is eager to discharge. She speculated that patient may be bothered by his divorce or may have some PTSD from his work in [employer], but notes patient is a private person in regard to his emotions and things that bother him.  Sister reports the patient does well when he stays away from alcohol. She thinks that he would benefit from outpatient therapy and medication management. She states she has no specific concerns for the patient discharging within a few days.  Darreld Mclean 11/29/2018, 11:11 AM

## 2018-11-29 NOTE — [facility] Group Notes (Signed)
[facility] LCSW Group Therapy Note  Date/Time: 11/29/2018, 1:30pm  Type of Therapy/Topic:  Group Therapy:  Feelings about Diagnosis  Participation Level:  Active   Mood: Pleasant and talkative   Description of Group:    This group will allow patients to explore their thoughts and feelings about diagnoses they have received. Patients will be guided to explore their level of understanding and acceptance of these diagnoses. Facilitator will encourage patients to process their thoughts and feelings about the reactions of others to their diagnosis, and will guide patients in identifying ways to discuss their diagnosis with significant others in their lives. This group will be process-oriented, with patients participating in exploration of their own experiences as well as giving and receiving support and challenge from other group members.   Therapeutic Goals: 1. Patient will demonstrate understanding of diagnosis as evidence by identifying two or more symptoms of the disorder:  2. Patient will be able to express two feelings regarding the diagnosis 3. Patient will demonstrate ability to communicate their needs through discussion and/or role plays  Summary of Patient Progress:   Pt was engaged throughout the entire group. Pt did open up and discuss his thoughts and feelings of having a diagnosis. Pt discussed how having support from coworkers and friends has helped with accepting his diagnosis and working towards treatment. Pt did show emotion and cried at the end of group as he discussed wanting to open up to help others and he has accepted that he needs the help. Pt opened up about having a diagnosis and trying to live a function life and what that looks like for him. Pt was talkative throughout the entire group and gave good insight.      Therapeutic Modalities:   Cognitive Behavioral Therapy Brief Therapy Feelings Identification   Stephannie Peters, LCSW

## 2018-11-29 NOTE — Progress Notes (Signed)
Adult Psychoeducational Group Note  Date:  11/29/2018 Time:  3:16 PM  Group Topic/Focus:  Goals Group:   The focus of this group is to help patients establish daily goals to achieve during treatment and discuss how the patient can incorporate goal setting into their daily lives to aide in recovery.  Participation Level:  Active  Participation Quality:  Attentive  Affect:  Appropriate  Cognitive:  Alert  Insight: Appropriate  Engagement in Group:  Engaged  Modes of Intervention:  Discussion  Additional Comments:  Pt attended group and participated in discussion.  Ellionna Buckbee R Young Brim 11/29/2018, 3:16 PM

## 2018-11-29 NOTE — Progress Notes (Signed)
Adult Psychoeducational Group Note  Date:  11/29/2018 Time:  10:15 PM  Group Topic/Focus:  Wrap-Up Group:   The focus of this group is to help patients review their daily goal of treatment and discuss progress on daily workbooks.  Participation Level:  Active  Participation Quality:  Appropriate  Affect:  Appropriate  Cognitive:  Alert  Insight: Appropriate  Engagement in Group:  Engaged  Modes of Intervention:  Discussion  Additional Comments:  Pt rated his day 7/10. His goal for today was to keep his blood sugar down today. Pt stated that he met his goal and is hopeful for discharge tomorrow.   [name] 11/29/2018, 10:15 PM

## 2018-11-29 NOTE — Progress Notes (Signed)
   11/29/18 2356  COVID-19 Daily Checkoff  Have you had a fever (temp > 37.80C/100F)  in the past 24 hours?  No  If you have had runny nose, nasal congestion, sneezing in the past 24 hours, has it worsened? No  COVID-19 EXPOSURE  Have you traveled outside the state in the past 14 days? No  Have you been in contact with someone with a confirmed diagnosis of COVID-19 or PUI in the past 14 days without wearing appropriate PPE? No  Have you been living in the same home as a person with confirmed diagnosis of COVID-19 or a PUI (household contact)? No  Have you been diagnosed with COVID-19? No

## 2018-11-29 NOTE — Plan of Care (Addendum)
Patient self inventory- Patient slept well last night, sleep medication was helpful. Appetite is good, energy level high, concentration good. Depression, hopelessness, and anxiety rated all 0/10. Denies withdrawal symptoms. Denies SI HI AVH. Denies physical pain. Patient's goal is "stop picking up a bottle of wine when depressed by staying away from the store." Patient is compliant with meds- no side effects noted. Safety maintained with 15 minute checks. Patient stated he is ready to discharge today and is ready to get back to work. 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 Patient was hyper and had pressured speech.   Problem: Education: Goal: Knowledge of [place] [facility] Education information/materials will improve Outcome: Progressing Goal: Emotional status will improve Outcome: Progressing Goal: Mental status will improve Outcome: Progressing Goal: Verbalization of understanding the information provided will improve Outcome: Progressing

## 2018-11-29 NOTE — Progress Notes (Signed)
Inpatient Diabetes Program Recommendations  AACE/ADA: New Consensus Statement on Inpatient Glycemic Control (2015)  Target Ranges:  Prepandial:   less than 140 mg/dL      Peak postprandial:   less than 180 mg/dL (1-2 hours)      Critically ill patients:  140 - 180 mg/dL   Lab Results  Component Value Date   GLUCAP 221 (H) 11/29/2018   HGBA1C 11.8 (H) 11/28/2018    Review of Glycemic Control  Diabetes history: DM2 Outpatient Diabetes medications: Lantus 25 units QHS, metformin 1000 mg bid, Actos 15 mg QD, Trulicity 1.5 mg Q Tuesday Current orders for Inpatient glycemic control: Lantus 25 units QHS, Novolog 0-9 units tidwc, metformin 1000 mg bid.  HgbA1C - 11.8% - uncontrolled  Inpatient Diabetes Program Recommendations:    Add Novolog 5 units tidwc for meal coverage insulin Increase Novolog to 0-15 units tidwc and HS  Will need f/u with PCP regarding management of uncontrolled DM at discharge.  Continue to follow blood sugars daily.  Thank you. Ailene Ards, RD, LDN, CDE Inpatient Diabetes Coordinator [phone]

## 2018-11-29 NOTE — Progress Notes (Signed)
[facility] MD Progress Note  11/29/2018 11:47 AM Kenneth Herrera  MRN:  [id number] Subjective:  Patient is a 53 year old male with a past psychiatric history significant for alcohol use disorder who was brought to [facility] under involuntary commitment.  Because of intoxication and threats to harm himself.  The patient stated that he had broken up with a girlfriend recently, and it started drinking again.  He apparently had had 2-1/2 years of sobriety.  He stated he got upset, and then was intoxicated.  While he was intoxicated he attempted to retrieve his AR 15 rifle from his bedroom.  He also threatened police officers.  He stated he would kill himself, but luckily the weapon was not loaded.  Objective: Patient is seen and examined.  Patient is a 53 year old male with the above-stated past psychiatric history is seen in follow-up.  He denied complaint today.  He denied any suicidal ideation.  He denied any withdrawal symptoms.  He denied any homicidal ideation.  Social work spoke to his sister today and all the weapons have been removed from the house.  This was confirmed by contacting [employer].  The sister also stated that she had no concerns about self-harm.  During the course the hospitalization he has at times appeared quite pressured, mildly euphoric.  I asked him about any histories of bipolar disorder.  He stated when he had worked on a psychiatric unit several years ago that 1 of the doctors thought he was "bipolar".  He stated he thought that those comments had been made because he was "giddy at times".  He denied any racing thoughts or pressured speech.  Even though he does have some pressured speech.  He stated he was willing to take medication if I thought to so it might be beneficial.  We discussed the fact that bipolar patients sometimes become involved in symptoms to self medicate.  His vital signs are stable.  He is afebrile.  His CIWA was 0.  His hemoglobin A1c on admission  was 11.8.  His mean plasma glucose is 291.96.  His Accu-Chek this morning was 221, and I increased his Lantus insulin to 30 units subcu nightly.  Principal Problem: MDD (major depressive disorder), single episode, severe , no psychosis (HCC) Diagnosis: Principal Problem:   MDD (major depressive disorder), single episode, severe , no psychosis (HCC) Active Problems:   Alcohol use disorder, severe, dependence (HCC)  Total Time spent with patient: 15 minutes  Past Psychiatric History: See admission H&P  Past Medical History:  Past Medical History:  Diagnosis Date  . Diabetes mellitus without complication (HCC)    History reviewed. No pertinent surgical history. Family History: History reviewed. No pertinent family history. Family Psychiatric  History: See admission H&P Social History:  Social History   Substance and Sexual Activity  Alcohol Use Not on file     Social History   Substance and Sexual Activity  Drug Use Not on file    Social History   Socioeconomic History  . Marital status: Divorced    Spouse name: Not on file  . Number of children: Not on file  . Years of education: Not on file  . Highest education level: Not on file  Occupational History  . Not on file  Social Needs  . Financial resource strain: Not on file  . Food insecurity:    Worry: Not on file    Inability: Not on file  . Transportation needs:    Medical: Not on file  Non-medical: Not on file  Tobacco Use  . Smoking status: Heavy Tobacco Smoker  . Smokeless tobacco: Current User  Substance and Sexual Activity  . Alcohol use: Not on file  . Drug use: Not on file  . Sexual activity: Not on file  Lifestyle  . Physical activity:    Days per week: Not on file    Minutes per session: Not on file  . Stress: Not on file  Relationships  . Social connections:    Talks on phone: Not on file    Gets together: Not on file    Attends religious service: Not on file    Active member of club or  organization: Not on file    Attends meetings of clubs or organizations: Not on file    Relationship status: Not on file  Other Topics Concern  . Not on file  Social History Narrative  . Not on file   Additional Social History:    Pain Medications: see MAR Prescriptions: see MAR Over the Counter: see MAR History of alcohol / drug use?: No history of alcohol / drug abuse                    Sleep: Good  Appetite:  Good  Current Medications: Current Facility-Administered Medications  Medication Dose Route Frequency Provider Last Rate Last Dose  . acetaminophen (TYLENOL) tablet 650 mg  650 mg Oral Q6H PRN Antonieta Pertlary, Girl Schissler Lawson, MD      . alum & mag hydroxide-simeth (MAALOX/MYLANTA) 200-200-20 MG/5ML suspension 30 mL  30 mL Oral Q4H PRN Antonieta Pertlary, Maryana Pittmon Lawson, MD      . chlordiazePOXIDE (LIBRIUM) capsule 25 mg  25 mg Oral Q6H PRN Antonieta Pertlary, Zamire Whitehurst Lawson, MD   25 mg at 11/28/18 1212  . folic acid (FOLVITE) tablet 1 mg  1 mg Oral Daily Antonieta Pertlary, Ramyah Pankowski Lawson, MD   1 mg at 11/28/18 1010  . gabapentin (NEURONTIN) capsule 300 mg  300 mg Oral TID Antonieta Pertlary, Khylei Wilms Lawson, MD   300 mg at 11/29/18 0750  . hydrOXYzine (ATARAX/VISTARIL) tablet 25 mg  25 mg Oral TID PRN Antonieta Pertlary, Cherryl Babin Lawson, MD      . insulin aspart (novoLOG) injection 0-9 Units  0-9 Units Subcutaneous TID WC Antonieta Pertlary, Reanna Scoggin Lawson, MD   3 Units at 11/29/18 [phone]  . insulin glargine (LANTUS) injection 30 Units  30 Units Subcutaneous QHS Antonieta Pertlary, Deshone Lyssy Lawson, MD      . magnesium hydroxide (MILK OF MAGNESIA) suspension 30 mL  30 mL Oral Daily PRN Antonieta Pertlary, Ohanna Gassert Lawson, MD      . metFORMIN (GLUCOPHAGE) tablet 1,000 mg  1,000 mg Oral BID WC Antonieta Pertlary, Arnav Cregg Lawson, MD   1,000 mg at 11/29/18 0750  . nicotine (NICODERM CQ - dosed in mg/24 hours) patch 21 mg  21 mg Transdermal Daily Antonieta Pertlary, Britni Driscoll Lawson, MD   21 mg at 11/28/18 1455  . OXcarbazepine (TRILEPTAL) tablet 150 mg  150 mg Oral BID Antonieta Pertlary, Abdel Effinger Lawson, MD      . thiamine (VITAMIN B-1) tablet 100 mg  100 mg Oral  Daily Antonieta Pertlary, Adele Milson Lawson, MD   100 mg at 11/29/18 0750  . traZODone (DESYREL) tablet 50 mg  50 mg Oral QHS PRN Antonieta Pertlary, Jada Fass Lawson, MD   50 mg at 11/28/18 2113    Lab Results:  Results for orders placed or performed during the hospital encounter of 11/27/18 (from the past 48 hour(s))  Glucose, capillary     Status: Abnormal   Collection Time:  11/27/18  5:47 PM  Result Value Ref Range   Glucose-Capillary 160 (H) 70 - 99 mg/dL  Glucose, capillary     Status: Abnormal   Collection Time: 11/27/18  8:11 PM  Result Value Ref Range   Glucose-Capillary 228 (H) 70 - 99 mg/dL  Glucose, capillary     Status: Abnormal   Collection Time: 11/28/18  5:50 AM  Result Value Ref Range   Glucose-Capillary 146 (H) 70 - 99 mg/dL  Glucose, capillary     Status: Abnormal   Collection Time: 11/28/18 11:54 AM  Result Value Ref Range   Glucose-Capillary 216 (H) 70 - 99 mg/dL  Glucose, capillary     Status: Abnormal   Collection Time: 11/28/18  4:57 PM  Result Value Ref Range   Glucose-Capillary 344 (H) 70 - 99 mg/dL  Hemoglobin B1Y     Status: Abnormal   Collection Time: 11/28/18  6:16 PM  Result Value Ref Range   Hgb A1c MFr Bld 11.8 (H) 4.8 - 5.6 %    Comment: (NOTE) Pre diabetes:          5.7%-6.4% Diabetes:              >6.4% Glycemic control for   <7.0% adults with diabetes    Mean Plasma Glucose 291.96 mg/dL    Comment: Performed at [facility], [address], [place], [place] [address]  Glucose, capillary     Status: Abnormal   Collection Time: 11/28/18  8:07 PM  Result Value Ref Range   Glucose-Capillary 326 (H) 70 - 99 mg/dL  Glucose, capillary     Status: Abnormal   Collection Time: 11/29/18  6:12 AM  Result Value Ref Range   Glucose-Capillary 221 (H) 70 - 99 mg/dL    Blood Alcohol level:  No results found for: [facility]  Metabolic Disorder Labs: Lab Results  Component Value Date   HGBA1C 11.8 (H) 11/28/2018   MPG 291.96 11/28/2018   No results found for: PROLACTIN No  results found for: CHOL, TRIG, HDL, CHOLHDL, VLDL, LDLCALC  Physical Findings: AIMS: Facial and Oral Movements Muscles of Facial Expression: None, normal Lips and Perioral Area: None, normal Jaw: None, normal Tongue: None, normal,Extremity Movements Upper (arms, wrists, hands, fingers): None, normal Lower (legs, knees, ankles, toes): None, normal, Trunk Movements Neck, shoulders, hips: None, normal, Overall Severity Severity of abnormal movements (highest score from questions above): None, normal Incapacitation due to abnormal movements: None, normal Patient's awareness of abnormal movements (rate only patient's report): No Awareness, Dental Status Current problems with teeth and/or dentures?: Yes Does patient usually wear dentures?: Yes  CIWA:  CIWA-Ar Total: 0 COWS:     Musculoskeletal: Strength & Muscle Tone: within normal limits Gait & Station: normal Patient leans: N/A  Psychiatric Specialty Exam: Physical Exam  Nursing note and vitals reviewed. Constitutional: He is oriented to person, place, and time. He appears well-developed and well-nourished.  HENT:  Head: Normocephalic and atraumatic.  Respiratory: Effort normal.  Neurological: He is alert and oriented to person, place, and time.    ROS  Blood pressure 123/78, pulse 68, temperature 97.7 F (36.5 C), temperature source Oral, resp. rate 18, height  (1.727 m), weight 74.8 kg, SpO2 97 %.Body mass index is 25.09 kg/m.  General Appearance: Casual  Eye Contact:  Fair  Speech:  Pressured  Volume:  Increased  Mood:  Euthymic  Affect:  Congruent  Thought Process:  Coherent and Descriptions of Associations: Intact  Orientation:  Full (Time,  Place, and Person)  Thought Content:  Logical  Suicidal Thoughts:  No  Homicidal Thoughts:  No  Memory:  Immediate;   Fair Recent;   Fair Remote;   Fair  Judgement:  Intact  Insight:  Fair  Psychomotor Activity:  Increased  Concentration:  Concentration: Fair and  Attention Span: Fair  Recall:  [employer] of Knowledge:  Fair  Language:  Fair  Akathisia:  Negative  Handed:  Right  AIMS (if indicated):     Assets:  Desire for Improvement Resilience  ADL's:  Intact  Cognition:  WNL  Sleep:  Number of Hours: 6.75     Treatment Plan Summary: Daily contact with patient to assess and evaluate symptoms and progress in treatment, Medication management and Plan : Patient is seen and examined.  Patient is a 53 year old male with the above-stated past psychiatric history who is seen in follow-up.   Diagnosis: #1 alcohol intoxication, #2 alcohol dependence, #3 substance-induced mood disorder, #4 poorly controlled diabetes mellitus type 2  Patient is doing well with regard to his alcohol related issues.  We did discuss the possibility of bipolar disorder, and he is willing to try medication.  I have added Trileptal 150 mg p.o. twice daily for mood stability and will see if that is beneficial.  His hemoglobin A1c is significantly elevated, and I have increased his Lantus to 30 units subcu nightly and we will continue the Metformin at thousand milligrams p.o. twice daily.  No change in his Neurontin.  We have confirmed the safety of his home, and hopefully if things go well in the next 24 hours we will consider discharge in the a.m. 1.  Continue Librium 25 mg p.o. every 6 hours PRN CIWA greater than 10 for alcohol withdrawal protection. 2.  Continue folic acid 1 mg p.o. daily for nutritional supplementation. 3.  Continue gabapentin 300 mg p.o. 3 times daily for chronic pain and anxiety. 4.  Increase Lantus insulin to 30 units subcu nightly for diabetic control. 5.  Continue Metformin thousand milligrams p.o. twice daily with meals for diabetes control. 6.  Add Trileptal 150 mg p.o. twice daily for mood stability. 7.  Continue thiamine 100 mg p.o. daily for nutritional supplementation. 8.  Continue trazodone 50 mg p.o. nightly as needed insomnia.  Antonieta Pert, MD 11/29/2018, 11:47 AM

## 2018-11-30 LAB — GLUCOSE, CAPILLARY
Glucose-Capillary: 130 mg/dL — ABNORMAL HIGH (ref 70–99)
Glucose-Capillary: 172 mg/dL — ABNORMAL HIGH (ref 70–99)

## 2018-11-30 MED ORDER — OXCARBAZEPINE 150 MG PO TABS: 150.0000 mg | ORAL_TABLET | Freq: Two times a day (BID) | ORAL | 0 refills | Status: DC

## 2018-11-30 MED ORDER — GABAPENTIN 300 MG PO CAPS: 300.0000 mg | ORAL_CAPSULE | Freq: Three times a day (TID) | ORAL | 0 refills | Status: DC

## 2018-11-30 MED ORDER — INSULIN GLARGINE 100 UNIT/ML ~~LOC~~ SOLN: 30.0000 [IU] | Freq: Every day | SUBCUTANEOUS | 0 refills | Status: DC

## 2018-11-30 MED ORDER — TRAZODONE HCL 50 MG PO TABS: 50.0000 mg | ORAL_TABLET | Freq: Every evening | ORAL | 0 refills | Status: DC | PRN

## 2018-11-30 MED ORDER — NICOTINE 21 MG/24HR TD PT24: 21.0000 mg | MEDICATED_PATCH | Freq: Every day | TRANSDERMAL | 0 refills | Status: DC

## 2018-11-30 MED ORDER — METFORMIN HCL 1000 MG PO TABS: 1000.0000 mg | ORAL_TABLET | Freq: Two times a day (BID) | ORAL | 0 refills | Status: DC

## 2018-11-30 NOTE — [facility] Suicide Risk Assessment (Signed)
[facility] Discharge Suicide Risk Assessment   Principal Problem: MDD (major depressive disorder), single episode, severe , no psychosis (HCC) Discharge Diagnoses: Principal Problem:   MDD (major depressive disorder), single episode, severe , no psychosis (HCC) Active Problems:   Alcohol use disorder, severe, dependence (HCC)   Total Time spent with patient: 15 minutes  Musculoskeletal: Strength & Muscle Tone: within normal limits Gait & Station: normal Patient leans: N/A  Psychiatric Specialty Exam: Review of Systems  All other systems reviewed and are negative.   Blood pressure 130/82, pulse 63, temperature 97.7 F (36.5 C), temperature source Oral, resp. rate 18, height 5\' 8"  (1.727 m), weight 74.8 kg, SpO2 97 %.Body mass index is 25.09 kg/m.  General Appearance: Casual  Eye Contact::  Fair  Speech:  Normal Rate409  Volume:  Normal  Mood:  Euthymic  Affect:  Congruent  Thought Process:  Coherent and Descriptions of Associations: Intact  Orientation:  Full (Time, Place, and Person)  Thought Content:  Logical  Suicidal Thoughts:  No  Homicidal Thoughts:  No  Memory:  Immediate;   Fair Recent;   Fair Remote;   Fair  Judgement:  Intact  Insight:  Fair  Psychomotor Activity:  Normal  Concentration:  Fair  Recall:  [employer] of Knowledge:Fair  Language: Fair  Akathisia:  Negative  Handed:  Right  AIMS (if indicated):     Assets:  Desire for Improvement Resilience  Sleep:  Number of Hours: 6.75  Cognition: WNL  ADL's:  Intact   Mental Status Per Nursing Assessment::   On Admission:  NA  Demographic Factors:  Male, Caucasian and Low socioeconomic status  Loss Factors: NA  Historical Factors: Impulsivity  Risk Reduction Factors:   Employed and Living with another person, especially a relative  Continued Clinical Symptoms:  Severe Anxiety and/or Agitation Alcohol/Substance Abuse/Dependencies  Cognitive Features That Contribute To Risk:  None    Suicide  Risk:  Minimal: No identifiable suicidal ideation.  Patients presenting with no risk factors but with morbid ruminations; may be classified as minimal risk based on the severity of the depressive symptoms  Follow-up Information    [facility] Follow up on 12/03/2018.   Why:  Hospital follow up appointment is Monday, 5/11 at 1:00p.  The appointment will be held over the phone and the therapist will contact you.  Contact information: [address] [place] [address] [phone]           Plan Of Care/Follow-up recommendations:  Activity:  ad lib  Antonieta Pert, MD 11/30/2018, 8:03 AM

## 2018-11-30 NOTE — Discharge Summary (Signed)
Physician Discharge Summary Note  Patient:  Kenneth Herrera is an 53 y.o., male MRN:  [id number] DOB:  [birth year] Patient phone:  [phone] (home)  Patient address:   [address] [place] [address],  Total Time spent with patient: 15 minutes  Date of Admission:  11/27/2018 Date of Discharge: 11/30/18  Reason for Admission:  Threats to harm self and others while intoxicated  Principal Problem: MDD (major depressive disorder), single episode, severe , no psychosis (HCC) Discharge Diagnoses: Principal Problem:   MDD (major depressive disorder), single episode, severe , no psychosis (HCC) Active Problems:   Alcohol use disorder, severe, dependence (HCC)   Past Psychiatric History: Per admission H&P: History of alcohol use disorder with reported sobriety for 2.5 years until relapse two days ago. Denies history of any other drug use. Denies prior hospitalizations for mental health/substance use or rehab. Denies history of mania or psychosis. He reports his PCP prescribed a medication for anxiety one year ago but he stopped after one month.  Past Medical History:  Past Medical History:  Diagnosis Date  . Diabetes mellitus without complication (HCC)    History reviewed. No pertinent surgical history. Family History: History reviewed. No pertinent family history. Family Psychiatric  History: Denies Social History:  Social History   Substance and Sexual Activity  Alcohol Use Not on file     Social History   Substance and Sexual Activity  Drug Use Not on file    Social History   Socioeconomic History  . Marital status: Divorced    Spouse name: Not on file  . Number of children: Not on file  . Years of education: Not on file  . Highest education level: Not on file  Occupational History  . Not on file  Social Needs  . Financial resource strain: Not on file  . Food insecurity:    Worry: Not on file    Inability: Not on file  . Transportation needs:    Medical: Not on  file    Non-medical: Not on file  Tobacco Use  . Smoking status: Heavy Tobacco Smoker  . Smokeless tobacco: Current User  Substance and Sexual Activity  . Alcohol use: Not on file  . Drug use: Not on file  . Sexual activity: Not on file  Lifestyle  . Physical activity:    Days per week: Not on file    Minutes per session: Not on file  . Stress: Not on file  Relationships  . Social connections:    Talks on phone: Not on file    Gets together: Not on file    Attends religious service: Not on file    Active member of club or organization: Not on file    Attends meetings of clubs or organizations: Not on file    Relationship status: Not on file  Other Topics Concern  . Not on file  Social History Narrative  . Not on file    Hospital Course:  From MD's admission SRA: Patient is a 53 year old male with a past psychiatric history significant for alcohol use disorder who was brought to [facility] under involuntary commitment. Because of intoxication and threats to harm himself. The patient stated that he had broken up with a girlfriend recently, and it started drinking again. He apparently had had 2-1/2 years of sobriety. He stated he got upset, and then was intoxicated. While he was intoxicated he attempted to retrieve his AR 15 rifle from his bedroom. He also threatened police officers.  He stated he would kill himself, but luckily the weapon was not loaded. It says in the notes that the patient had a history of inpatient treatment for depression, but the patient denied that to me today. 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 He denied any other substances besides alcohol. There was mention in the notes from [facility] that the patient had a history of polysubstance use disorder, but his drug screen from [place] was completely negative. He currently denied suicidal ideation. He currently denied any helplessness, hopelessness or  worthlessness. Review of his laboratories showed a normal CBC including a normal MCV. His electrolytes were all essentially normal as well except for an elevated blood sugar at 188. His liver function enzymes were normal. His blood alcohol in the emergency department at [facility] was 0.22. He was admitted to the hospital for evaluation and stabilization.  Mr. Mckimmy was admitted after threatening police and threatening self-harm with an AR 15 while intoxicated with alcohol. He denied any recent or remote history of depression. He denied suicidal ideation prior to becoming drunk. He denied recent use of alcohol prior to the day he was brought to [facility]. He denied history of mania but did present with pressured speech. He was started on Trileptal for mood stability. Hemoglobin a1c was 11.8, and Lantus was increased. Collateral information was obtained from sister, who denied safety concerns for discharge. Sister reported [facility] department had taken possession of patient's firearms, and CSW confirmed this information with the sheriff's department. Patient denied SI/HI throughout admission. Mr. Mutschler remained on the [facility] unit for 3 days. He stabilized with medication and therapy. He was discharged on the medications listed below. He has shown stable mood, affect, sleep, appetite, and interaction. He denies any SI/HI/AVH and contracts for safety. He agrees to follow up at [facility] (see below). He is provided with prescriptions and medication samples upon discharge. His father is picking him up for discharge home.  Physical Findings: AIMS: Facial and Oral Movements Muscles of Facial Expression: None, normal Lips and Perioral Area: None, normal Jaw: None, normal Tongue: None, normal,Extremity Movements Upper (arms, wrists, hands, fingers): None, normal Lower (legs, knees, ankles, toes): None, normal, Trunk Movements Neck, shoulders, hips: None, normal, Overall Severity Severity of  abnormal movements (highest score from questions above): None, normal Incapacitation due to abnormal movements: None, normal Patient's awareness of abnormal movements (rate only patient's report): No Awareness, Dental Status Current problems with teeth and/or dentures?: Yes Does patient usually wear dentures?: Yes  CIWA:  CIWA-Ar Total: 0 COWS:  COWS Total Score: 4  Musculoskeletal: Strength & Muscle Tone: within normal limits Gait & Station: normal Patient leans: N/A  Psychiatric Specialty Exam: Physical Exam  Nursing note and vitals reviewed. Constitutional: He is oriented to person, place, and time. He appears well-developed and well-nourished.  Cardiovascular: Normal rate.  Respiratory: Effort normal.  Neurological: He is alert and oriented to person, place, and time.    Review of Systems  Constitutional: Negative.   Psychiatric/Behavioral: Positive for substance abuse (ETOH). Negative for depression, hallucinations and suicidal ideas. The patient is not nervous/anxious and does not have insomnia.     Blood pressure 130/82, pulse 63, temperature 97.7 F (36.5 C), temperature source Oral, resp. rate 18, height  (1.727 m), weight 74.8 kg, SpO2 97 %.Body mass index is 25.09 kg/m.  See MD's discharge SRA     Have you used any form of tobacco in the last 30  days? (Cigarettes, Smokeless Tobacco, Cigars, and/or Pipes): Yes  Has this patient used any form of tobacco in the last 30 days? (Cigarettes, Smokeless Tobacco, Cigars, and/or Pipes) Yes, a prescription for an FDA-approved medication for tobacco cessation was offered at discharge.   Blood Alcohol level:  No results found for: [facility]  Metabolic Disorder Labs:  Lab Results  Component Value Date   HGBA1C 11.8 (H) 11/28/2018   MPG 291.96 11/28/2018   No results found for: PROLACTIN No results found for: CHOL, TRIG, HDL, CHOLHDL, VLDL, LDLCALC  See Psychiatric Specialty Exam and Suicide Risk Assessment completed by  Attending Physician prior to discharge.  Discharge destination:  Home  Is patient on multiple antipsychotic therapies at discharge:  No   Has Patient had three or more failed trials of antipsychotic monotherapy by history:  No  Recommended Plan for Multiple Antipsychotic Therapies: NA  Discharge Instructions    Discharge instructions   Complete by:  As directed    Patient is instructed to take all prescribed medications as recommended. Report any side effects or adverse reactions to your outpatient psychiatrist. Patient is instructed to abstain from alcohol and illegal drugs while on prescription medications. In the event of worsening symptoms, patient is instructed to call the crisis hotline, 911, or go to the nearest emergency department for evaluation and treatment. Follow up with primary care provider for diabetes management.     Allergies as of 11/30/2018   No Known Allergies     Medication List    STOP taking these medications   atorvastatin 20 MG tablet Commonly known as:  LIPITOR   pioglitazone 15 MG tablet Commonly known as:  ACTOS     TAKE these medications     Indication  Dulaglutide 1.5 MG/0.5ML Sopn Inject 1.5 mg into the skin once a week. On Tuesday  Indication:  Type 2 Diabetes   gabapentin 300 MG capsule Commonly known as:  NEURONTIN Take 1 capsule (300 mg total) by mouth 3 (three) times daily.  Indication:  Abuse or Misuse of Alcohol   insulin glargine 100 UNIT/ML injection Commonly known as:  LANTUS Inject 0.3 mLs (30 Units total) into the skin at bedtime. What changed:    how much to take  when to take this  Indication:  Type 2 Diabetes   metFORMIN 1000 MG tablet Commonly known as:  GLUCOPHAGE Take 1 tablet (1,000 mg total) by mouth 2 (two) times daily with a meal. For diabetes What changed:    when to take this  additional instructions  Indication:  Type 2 Diabetes   nicotine 21 mg/24hr patch Commonly known as:  NICODERM CQ - dosed  in mg/24 hours Place 1 patch (21 mg total) onto the skin daily. For smoking cessation Start taking on:  Dec 01, 2018  Indication:  Nicotine Addiction   OXcarbazepine 150 MG tablet Commonly known as:  TRILEPTAL Take 1 tablet (150 mg total) by mouth 2 (two) times daily. For mood stability  Indication:  Mood   traZODone 50 MG tablet Commonly known as:  DESYREL Take 1 tablet (50 mg total) by mouth at bedtime as needed for sleep.  Indication:  Trouble Sleeping      Follow-up [facility] Follow up on 12/03/2018.   Why:  Hospital follow up appointment is Monday, 5/11 at 1:00p.  The appointment will be held over the phone and the therapist will contact you.  Contact information: [address] [place] [id number] [phone]  Follow-up recommendations: Activity as tolerated. Diet as recommended by primary care physician. Keep all scheduled follow-up appointments as recommended.   Comments:   Patient is instructed to take all prescribed medications as recommended. Report any side effects or adverse reactions to your outpatient psychiatrist. Patient is instructed to abstain from alcohol and illegal drugs while on prescription medications. In the event of worsening symptoms, patient is instructed to call the crisis hotline, 911, or go to the nearest emergency department for evaluation and treatment.  Signed: Aldean Baker, NP 11/30/2018, 9:45 AM

## 2018-11-30 NOTE — Progress Notes (Signed)
Patient educated about follow up care, upcoming appointments reviewed. Patient verbalizes understanding of all follow up appointments. AVS and suicide safety plan reviewed. Patient expresses no concerns or questions at this time. Educated on prescriptions and medication regimen. Patient belongings returned. Patient denies SI, HI, AVH at this time. Educated patient about suicide help resources and hotline, encouraged to call for assistance in the event of a crisis. Patient agrees. Patient is ambulatory and safe at time of discharge. Patient discharged to hospital lobby at this time. 

## 2018-11-30 NOTE — Progress Notes (Signed)
Inpatient Diabetes Program Recommendations  AACE/ADA: New Consensus Statement on Inpatient Glycemic Control (2015)  Target Ranges:  Prepandial:   less than 140 mg/dL      Peak postprandial:   less than 180 mg/dL (1-2 hours)      Critically ill patients:  140 - 180 mg/dL   Lab Results  Component Value Date   GLUCAP 130 (H) 11/30/2018   HGBA1C 11.8 (H) 11/28/2018    Review of Glycemic Control  On 5/7, pt received 29 units of Novolog and in a 4 hour period, blood sugars dropped from 478 to 173. Needs scheduled meal coverage insulin rather than reactive boluses that could cause hypoglycemia.  Inpatient Diabetes Program Recommendations:      Novolog 5 units tidwc for meal coverage insulin if pt eats > 50% meal.  Will secure-text MD with recs.   Continue to follow.  Thank you. Ailene Ards, RD, LDN, CDE Inpatient Diabetes Coordinator [phone]

## 2018-11-30 NOTE — Progress Notes (Signed)
Kenneth Herrera NOVEL CORONAVIRUS (COVID-19) DAILY CHECK-OFF SYMPTOMS - answer yes or no to each - every day NO YES  Have you had a fever in the past 24 hours?  . Fever (Temp > 37.80C / 100F) X   Have you had any of these symptoms in the past 24 hours? . New Cough .  Sore Throat  .  Shortness of Breath .  Difficulty Breathing .  Unexplained Body Aches   X   Have you had any one of these symptoms in the past 24 hours not related to allergies?   . Runny Nose .  Nasal Congestion .  Sneezing   X   If you have had runny nose, nasal congestion, sneezing in the past 24 hours, has it worsened?  X   EXPOSURES - check yes or no X   Have you traveled outside the state in the past 14 days?  X   Have you been in contact with someone with a confirmed diagnosis of COVID-19 or PUI in the past 14 days without wearing appropriate PPE?  X   Have you been living in the same home as a person with confirmed diagnosis of COVID-19 or a PUI (household contact)?    X   Have you been diagnosed with COVID-19?    X              What to do next: Answered NO to all: Answered YES to anything:   Proceed with unit schedule Follow the [facility] Inpatient Flowsheet.   

## 2018-11-30 NOTE — Progress Notes (Addendum)
  [facility] Adult Case Management Discharge Plan :  Will you be returning to the same living situation after discharge:  Yes,  yes home with dad At discharge, do you have transportation home?: Yes,  father is picking him up Do you have the ability to pay for your medications: No.; No insurance; [facility]  Release of information consent forms completed and in the chart;  Patient's signature needed at discharge.  Patient to Follow up at: Follow-up Information    [facility] Follow up on 12/03/2018.   Why:  Hospital follow up appointment is Monday, 5/11 at 1:00p.  The appointment will be held over the phone and the therapist will contact you.  Contact information: [address] [place] [address] [phone]           Next level of care provider has access to [facility] Link:no  Safety Planning and Suicide Prevention discussed: Yes,  with sister  Have you used any form of tobacco in the last 30 days? (Cigarettes, Smokeless Tobacco, Cigars, and/or Pipes): Yes  Has patient been referred to the Quitline?: Patient refused referral  Patient has been referred for addiction treatment: Pt. refused referral  Delphia Grates, LCSW 11/30/2018, 10:00 AM

## 2019-10-02 DIAGNOSIS — M5412 Radiculopathy, cervical region: Secondary | ICD-10-CM | POA: Insufficient documentation

## 2019-10-13 IMAGING — DX DG FINGER LITTLE 2+V*L*
3 series · 3 of 3 positions shown · non-contrast
Comparison: None.

CLINICAL DATA: Injury

EXAM:
LEFT LITTLE FINGER 2+V

[finger ap]
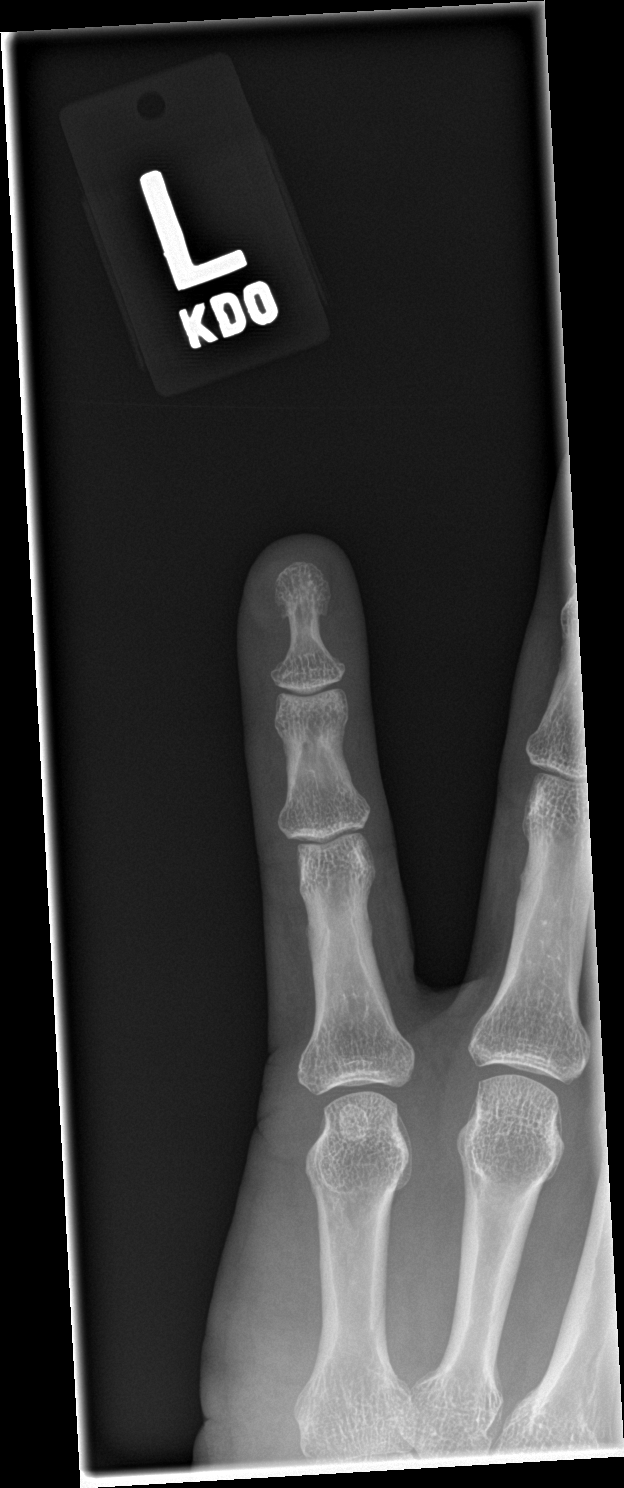

[finger obl]
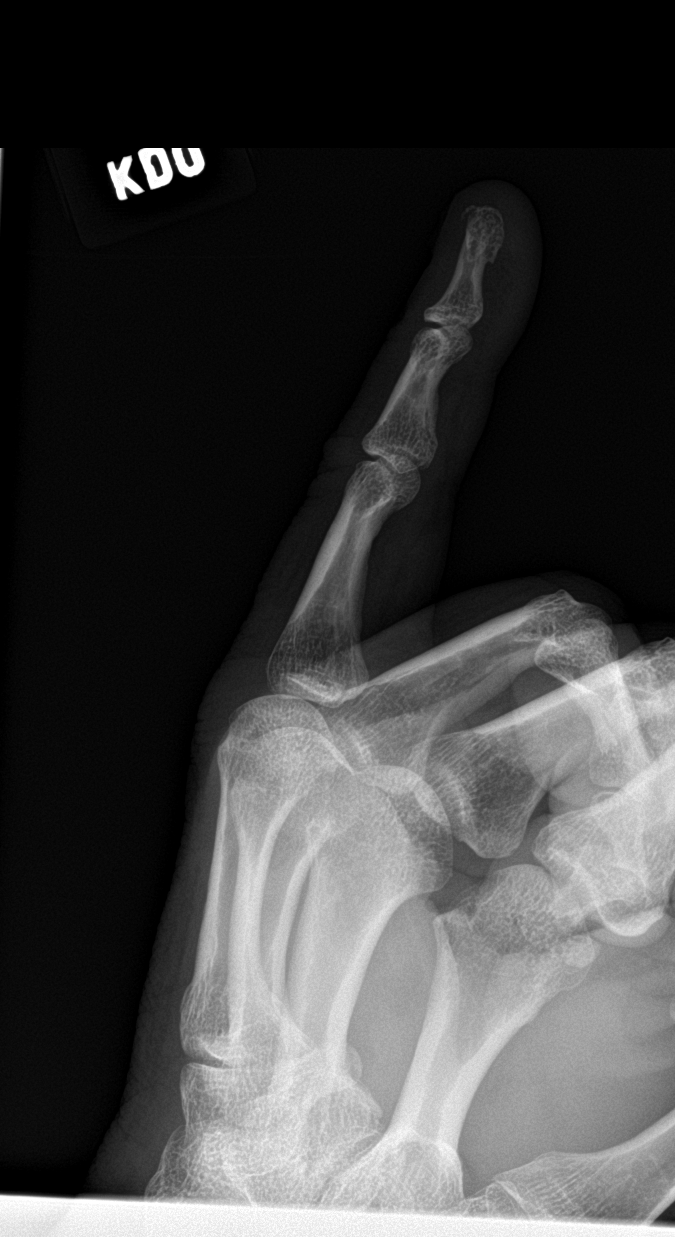

[finger lat]
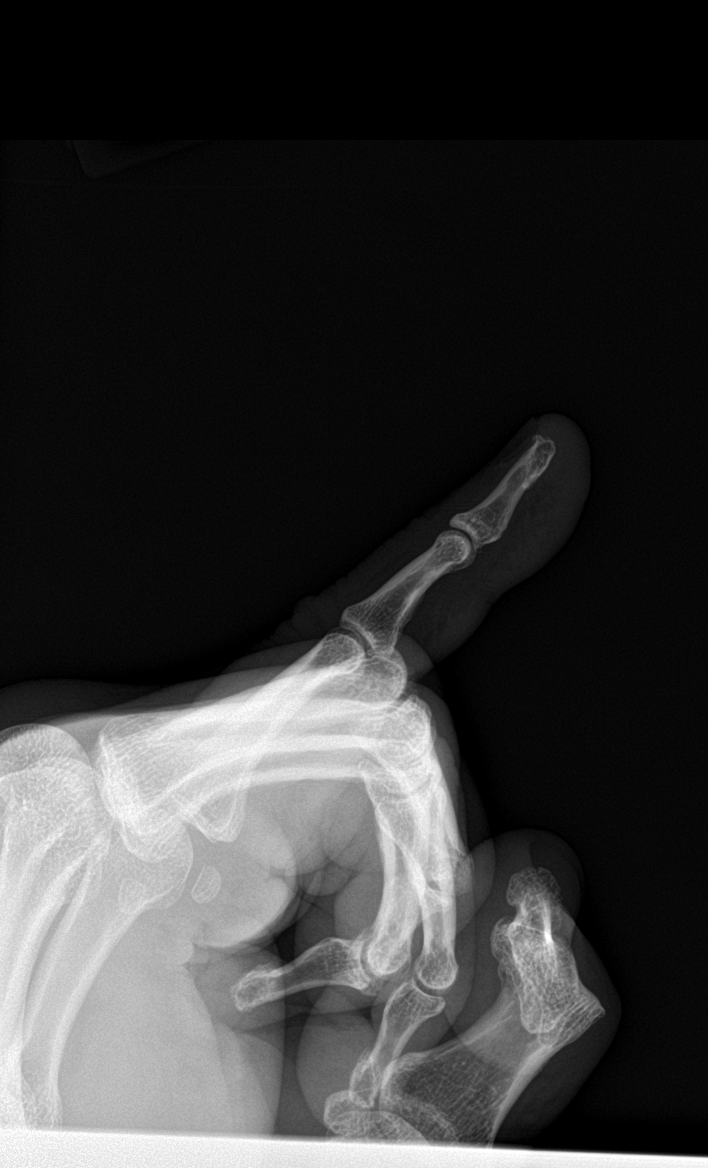

[3 of 3 positions shown; findings below may reference images not displayed]

FINDINGS: Tiny tuft fracture dorsal cortex of the distal phalanx. No
subluxation. No radiopaque foreign body.
IMPRESSION: Suspected tiny tuft fracture involving the dorsal aspect of the
fifth distal phalanx

## 2019-11-14 DIAGNOSIS — Z01818 Encounter for other preprocedural examination: Secondary | ICD-10-CM

## 2020-03-23 DIAGNOSIS — Z0000 Encounter for general adult medical examination without abnormal findings: Secondary | ICD-10-CM | POA: Insufficient documentation

## 2020-04-06 DIAGNOSIS — R1114 Bilious vomiting: Secondary | ICD-10-CM | POA: Insufficient documentation

## 2021-04-07 DIAGNOSIS — R197 Diarrhea, unspecified: Secondary | ICD-10-CM | POA: Insufficient documentation

## 2021-09-08 DIAGNOSIS — D126 Benign neoplasm of colon, unspecified: Secondary | ICD-10-CM

## 2021-09-13 DIAGNOSIS — B3781 Candidal esophagitis: Secondary | ICD-10-CM

## 2021-09-13 DIAGNOSIS — K250 Acute gastric ulcer with hemorrhage: Secondary | ICD-10-CM | POA: Insufficient documentation

## 2021-10-12 DIAGNOSIS — L0293 Carbuncle, unspecified: Secondary | ICD-10-CM | POA: Insufficient documentation

## 2021-10-18 DIAGNOSIS — E1042 Type 1 diabetes mellitus with diabetic polyneuropathy: Secondary | ICD-10-CM

## 2021-10-18 DIAGNOSIS — Z794 Long term (current) use of insulin: Secondary | ICD-10-CM

## 2021-11-26 DIAGNOSIS — N410 Acute prostatitis: Secondary | ICD-10-CM | POA: Insufficient documentation

## 2022-01-24 DIAGNOSIS — L0390 Cellulitis, unspecified: Secondary | ICD-10-CM | POA: Insufficient documentation

## 2022-01-24 DIAGNOSIS — L089 Local infection of the skin and subcutaneous tissue, unspecified: Secondary | ICD-10-CM | POA: Insufficient documentation

## 2022-01-24 DIAGNOSIS — E11621 Type 2 diabetes mellitus with foot ulcer: Secondary | ICD-10-CM | POA: Insufficient documentation

## 2022-02-07 DIAGNOSIS — Z4889 Encounter for other specified surgical aftercare: Secondary | ICD-10-CM

## 2022-02-14 DIAGNOSIS — Z89422 Acquired absence of other left toe(s): Secondary | ICD-10-CM | POA: Insufficient documentation

## 2022-06-23 DIAGNOSIS — M205X2 Other deformities of toe(s) (acquired), left foot: Secondary | ICD-10-CM

## 2022-08-16 DIAGNOSIS — L97522 Non-pressure chronic ulcer of other part of left foot with fat layer exposed: Secondary | ICD-10-CM

## 2022-08-16 DIAGNOSIS — L84 Corns and callosities: Secondary | ICD-10-CM | POA: Insufficient documentation

## 2022-10-25 DIAGNOSIS — I361 Nonrheumatic tricuspid (valve) insufficiency: Secondary | ICD-10-CM | POA: Diagnosis not present

## 2022-10-25 DIAGNOSIS — R7989 Other specified abnormal findings of blood chemistry: Secondary | ICD-10-CM | POA: Diagnosis not present

## 2022-10-25 DIAGNOSIS — E1310 Other specified diabetes mellitus with ketoacidosis without coma: Secondary | ICD-10-CM | POA: Diagnosis not present

## 2022-10-26 DIAGNOSIS — E1310 Other specified diabetes mellitus with ketoacidosis without coma: Secondary | ICD-10-CM | POA: Diagnosis not present

## 2022-10-26 DIAGNOSIS — E785 Hyperlipidemia, unspecified: Secondary | ICD-10-CM | POA: Diagnosis not present

## 2022-10-26 DIAGNOSIS — R7989 Other specified abnormal findings of blood chemistry: Secondary | ICD-10-CM | POA: Diagnosis not present

## 2022-10-27 DIAGNOSIS — E1310 Other specified diabetes mellitus with ketoacidosis without coma: Secondary | ICD-10-CM | POA: Diagnosis not present

## 2022-10-27 DIAGNOSIS — R7989 Other specified abnormal findings of blood chemistry: Secondary | ICD-10-CM | POA: Diagnosis not present

## 2022-10-27 DIAGNOSIS — E785 Hyperlipidemia, unspecified: Secondary | ICD-10-CM | POA: Diagnosis not present

## 2022-10-28 DIAGNOSIS — I10 Essential (primary) hypertension: Secondary | ICD-10-CM | POA: Diagnosis not present

## 2022-10-28 DIAGNOSIS — E1310 Other specified diabetes mellitus with ketoacidosis without coma: Secondary | ICD-10-CM | POA: Diagnosis not present

## 2022-10-28 DIAGNOSIS — R7989 Other specified abnormal findings of blood chemistry: Secondary | ICD-10-CM | POA: Diagnosis not present

## 2022-10-28 DIAGNOSIS — E785 Hyperlipidemia, unspecified: Secondary | ICD-10-CM | POA: Diagnosis not present

## 2022-10-29 DIAGNOSIS — J189 Pneumonia, unspecified organism: Secondary | ICD-10-CM

## 2022-10-29 DIAGNOSIS — E785 Hyperlipidemia, unspecified: Secondary | ICD-10-CM | POA: Diagnosis not present

## 2022-10-29 DIAGNOSIS — I10 Essential (primary) hypertension: Secondary | ICD-10-CM | POA: Diagnosis not present

## 2022-10-29 DIAGNOSIS — E1310 Other specified diabetes mellitus with ketoacidosis without coma: Secondary | ICD-10-CM | POA: Diagnosis not present

## 2022-10-29 DIAGNOSIS — R7989 Other specified abnormal findings of blood chemistry: Secondary | ICD-10-CM | POA: Diagnosis not present

## 2022-10-30 DIAGNOSIS — E1310 Other specified diabetes mellitus with ketoacidosis without coma: Secondary | ICD-10-CM | POA: Diagnosis not present

## 2022-10-30 DIAGNOSIS — I10 Essential (primary) hypertension: Secondary | ICD-10-CM | POA: Diagnosis not present

## 2022-10-30 DIAGNOSIS — R7989 Other specified abnormal findings of blood chemistry: Secondary | ICD-10-CM | POA: Diagnosis not present

## 2022-10-30 DIAGNOSIS — E785 Hyperlipidemia, unspecified: Secondary | ICD-10-CM | POA: Diagnosis not present

## 2022-11-08 DIAGNOSIS — J9601 Acute respiratory failure with hypoxia: Secondary | ICD-10-CM | POA: Insufficient documentation

## 2022-11-08 DIAGNOSIS — E1110 Type 2 diabetes mellitus with ketoacidosis without coma: Secondary | ICD-10-CM | POA: Insufficient documentation

## 2022-11-08 DIAGNOSIS — J189 Pneumonia, unspecified organism: Secondary | ICD-10-CM

## 2022-11-08 DIAGNOSIS — I214 Non-ST elevation (NSTEMI) myocardial infarction: Secondary | ICD-10-CM

## 2022-11-23 DIAGNOSIS — M21372 Foot drop, left foot: Secondary | ICD-10-CM | POA: Insufficient documentation

## 2022-12-07 ENCOUNTER — Ambulatory Visit: Payer: BC Managed Care – PPO | Attending: Cardiology | Admitting: Cardiology

## 2022-12-07 ENCOUNTER — Encounter: Payer: Self-pay | Admitting: Cardiology

## 2022-12-07 VITALS — BP 126/76 | HR 78 | Ht 69.0 in | Wt 190.0 lb

## 2022-12-07 DIAGNOSIS — Z794 Long term (current) use of insulin: Secondary | ICD-10-CM

## 2022-12-07 DIAGNOSIS — E782 Mixed hyperlipidemia: Secondary | ICD-10-CM | POA: Diagnosis not present

## 2022-12-07 DIAGNOSIS — E1042 Type 1 diabetes mellitus with diabetic polyneuropathy: Secondary | ICD-10-CM | POA: Diagnosis not present

## 2022-12-07 DIAGNOSIS — I214 Non-ST elevation (NSTEMI) myocardial infarction: Secondary | ICD-10-CM | POA: Diagnosis not present

## 2022-12-07 MED ORDER — ASPIRIN 81 MG PO TBEC: 81.0000 mg | DELAYED_RELEASE_TABLET | Freq: Every day | ORAL | 3 refills | Status: AC

## 2022-12-07 MED ORDER — ATORVASTATIN CALCIUM 20 MG PO TABS: 20.0000 mg | ORAL_TABLET | Freq: Every day | ORAL | 3 refills | Status: AC

## 2022-12-07 NOTE — Patient Instructions (Signed)
Medication Instructions:  Your physician has recommended you make the following change in your medication:   START: Lipitor 20 mg daily START: Aspirin 81 mg daily  *If you need a refill on your cardiac medications before your next appointment, please call your pharmacy*   Lab Work: Your physician recommends that you return for lab work in:   Labs in 6 weeks: Lipid, LFT  If you have labs (blood work) drawn today and your tests are completely normal, you will receive your results only by: MyChart Message (if you have MyChart) OR A paper copy in the mail If you have any lab test that is abnormal or we need to change your treatment, we will call you to review the results.   Testing/Procedures:   [facility] [address] [place], [place] [address] Phone:  [phone]    Please arrive 15 minutes prior to your appointment time for registration and insurance purposes.  The test will take approximately 3 to 4 hours to complete; you may bring reading material.  If someone comes with you to your appointment, they will need to remain in the main lobby due to limited space in the testing area. **If you are pregnant or breastfeeding, please notify the nuclear lab prior to your appointment**  How to prepare for your Myocardial Perfusion Test: Do not eat or drink 3 hours prior to your test, except you may have water. Do not consume products containing caffeine (regular or decaffeinated) 12 hours prior to your test. (ex: coffee, chocolate, sodas, tea). Do bring a list of your current medications with you.  If not listed below, you may take your medications as normal. Do wear comfortable clothes (no dresses or overalls) and walking shoes, tennis shoes preferred (No heels or open toe shoes are allowed). Do NOT wear cologne, perfume, aftershave, or lotions (deodorant is allowed). If these instructions are not followed, your test will have to be rescheduled.  Please  report to [address] for your test.  If you have questions or concerns about your appointment, you can call the [facility] Lab at [phone].  If you cannot keep your appointment, please provide 24 hours notification to the Nuclear Lab, to avoid a possible $50 charge to your account.    Follow-Up: At [facility], you and your health needs are our priority.  As part of our continuing mission to provide you with exceptional heart care, we have created designated Provider Care Teams.  These Care Teams include your primary Cardiologist (physician) and Advanced Practice Providers (APPs -  Physician Assistants and Nurse Practitioners) who all work together to provide you with the care you need, when you need it.  We recommend signing up for the patient portal called "MyChart".  Sign up information is provided on this After Visit Summary.  MyChart is used to connect with patients for Virtual Visits (Telemedicine).  Patients are able to view lab/test results, encounter notes, upcoming appointments, etc.  Non-urgent messages can be sent to your provider as well.   To learn more about what you can do with MyChart, go to [email or web address].    Your next appointment:   6 month(s)  Provider:   Gypsy Balsam, MD    Other Instructions None

## 2022-12-07 NOTE — Addendum Note (Signed)
Addended by: Roxanne Mins I on: 12/07/2022 02:16 PM   Modules accepted: Orders

## 2022-12-07 NOTE — Addendum Note (Signed)
Addended by: Roxanne Mins I on: 12/07/2022 02:17 PM   Modules accepted: Orders

## 2022-12-07 NOTE — Progress Notes (Signed)
Cardiology Office Note:    Date:  12/07/2022   ID:  Kenneth Herrera, DOB 1966-02-21, MRN [id number]  PCP:  Olive Bass, MD  Cardiologist:  Gypsy Balsam, MD    Referring MD: Olive Bass, MD   Chief Complaint  Patient presents with   RH follow up    History of Present Illness:    Kenneth Herrera is a 57 y.o. male with past medical history significant for type 2 diabetes for 7 years, he did have multiple hospitalization with DKA in the middle of [place] I met him first time at the beginning of April when he was in the hospital with DKA the reason I was asked to see him in the hospital is defined his troponin I was abnormal.  It was 1.92, 3.22, 2.25.  EKG did not show any acute changes, echocardiogram showed preserved left ventricle ejection fraction with no segmental motion abnormality, initial impression was that this is most likely acute coronary syndrome type II related to demand and DKA.  And the plan was to follow him up as an outpatient to do his ischemia workup and that is why he is here today.  Overall he seems to be doing well, however, it look like he does not take his aspirin he does not take his statin, he also tells me that his sugar is still elevated close to 500.  He does see endocrinologist and he did see endocrinology just few days ago on 13th of this month. He comes today to months for follow-up.  Cardiac wise doing well.  Denies of any chest pain tightness squeezing pressure burning chest no palpitations dizziness swelling of lower extremities.  He is frustrated with his equipment with his CGM they are giving him warnings about high sugar.  We does not trust this however when he checked his sugar by pricking his finger those numbers are similar to what CGM reads.  He does not smoke again he stopped taking his aspirin as well as Lipitor he simply ran out of it and was not refilled for him.  Past Medical History:  Diagnosis Date   Acute gastric ulcer with hemorrhage  09/13/2021   Formatting of this note might be different from the original. 08/2021: hosp, EGD, PPI Formatting of this note might be different from the original. 08/2021: hosp, EGD, PPI   Acute hypoxemic respiratory failure (HCC) 11/08/2022   Formatting of this note might be different from the original. 10/2022: multilobar CAP   Aftercare following surgery 02/07/2022   Alcohol use disorder, severe, dependence (HCC) 11/28/2018   Bilious vomiting with nausea 04/06/2020   Formatting of this note might be different from the original. 04/06/2020 Formatting of this note might be different from the original. 04/06/2020   Carbuncle 10/12/2021   Formatting of this note might be different from the original. 10/12/2021 : occiput x 3 Formatting of this note might be different from the original. 10/12/2021 : occiput x 3   Cellulitis 01/24/2022   Formatting of this note might be different from the original. 01/24/2022 Formatting of this note might be different from the original. 01/24/2022   Cervical radiculopathy 10/02/2019   Formatting of this note might be different from the original. 2021: RUE, non-specific Formatting of this note might be different from the original. 2021: RUE, non-specific   Community acquired pneumonia 11/08/2022   Formatting of this note might be different from the original. 10/2022: resp failure, adm. multilobar   Diabetes mellitus without complication (HCC)  Diabetic ketoacidosis without coma associated with type 2 diabetes mellitus (HCC) 11/08/2022   Formatting of this note might be different from the original. 10/2022: CBG 900, with CAP   Diabetic ulcer of toe of right foot associated with type 2 diabetes mellitus, with fat layer exposed (HCC) 01/24/2022   Formatting of this note might be different from the original. 01/24/2022: right 2nd toe Formatting of this note might be different from the original. 01/24/2022: right 2nd toe   Diarrhea of presumed infectious origin 04/07/2021   Formatting of  this note might be different from the original. 04/07/2021: Formatting of this note might be different from the original. 04/07/2021:   Erectile dysfunction 01/14/2016   Esophageal candidiasis (HCC) 09/13/2021   Formatting of this note might be different from the original. 08/2021: hosp odynophagia, DKA, GIB with EGD Formatting of this note might be different from the original. 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 10/2022: DKA, CAP with ARF   Other deformities of toe(s) (acquired), left foot 06/23/2022   Pre-ulcerative calluses 08/16/2022   Prostatitis, acute 11/26/2021   Formatting of this note might be different from the original. 11/26/2021 Formatting of this note might be different from the original. 11/26/2021   Recurrent major depressive disorder, in full remission (HCC) 09/22/2015   S/P amputation of lesser toe, left (HCC) 02/14/2022   Tubular adenoma of colon 09/08/2021   Formatting of this note might be different from the original. 09/08/2021 Formatting of this note might be different from the original. 09/08/2021   Type 1 diabetes mellitus with diabetic polyneuropathy (HCC) 10/18/2021   Wellness examination 03/23/2020    History reviewed. No pertinent surgical history.  Current Medications: Current Meds  Medication Sig   insulin lispro (HUMALOG) 100 UNIT/ML injection Inject 1 Units into the skin 3 (three) times daily before meals.   OXYGEN Inhale 4 L into the lungs daily.   [DISCONTINUED] Dulaglutide 1.5 MG/0.5ML SOPN Inject 1.5 mg into the skin once a week. On  Tuesday   [DISCONTINUED] gabapentin (NEURONTIN) 300 MG capsule Take 1 capsule (300 mg total) by mouth 3 (three) times daily.   [DISCONTINUED] insulin glargine (LANTUS) 100 UNIT/ML injection Inject 0.3 mLs (30 Units total) into the skin at bedtime.   [DISCONTINUED] metFORMIN (GLUCOPHAGE) 1000 MG tablet Take 1 tablet (1,000 mg total) by mouth 2 (two) times daily with a meal. For diabetes   [DISCONTINUED] nicotine (NICODERM CQ - DOSED IN MG/24 HOURS) 21 mg/24hr patch Place 1 patch (21 mg total) onto the skin daily. For smoking cessation   [DISCONTINUED] OXcarbazepine (TRILEPTAL) 150 MG tablet Take 1 tablet (150 mg total) by mouth 2 (two) times daily. For mood stability   [DISCONTINUED] traZODone (DESYREL) 50 MG tablet Take 1 tablet (50 mg total) by mouth at bedtime as needed for sleep.     Allergies:   Patient has no known allergies.   Social History   Socioeconomic History   Marital status: Single    Spouse name: Not on file   Number of children: Not on file   Years of education: Not on file  Highest education level: Not on file  Occupational History   Not on file  Tobacco Use   Smoking status: Former    Types: Cigarettes   Smokeless tobacco: Current  Substance and Sexual Activity   Alcohol use: Not Currently   Drug use: Never   Sexual activity: Not Currently  Other Topics Concern   Not on file  Social History Narrative   ** Merged History Encounter **       Social Determinants of Health   Financial Resource Strain: Not on file  Food Insecurity: Not on file  Transportation Needs: Not on file  Physical Activity: Not on file  Stress: Not on file  Social Connections: Not on file     Family History: The patient's family history is not on file. ROS:   Please see the history of present illness.    All 14 point review of systems negative except as described per history of present illness  EKGs/Labs/Other Studies Reviewed:      Recent Labs: No results found for  requested labs within last 365 days.  Recent Lipid Panel No results found for: "CHOL", "TRIG", "HDL", "CHOLHDL", "VLDL", "LDLCALC", "LDLDIRECT"  Physical Exam:    VS:  BP 126/76 (BP Location: Left Arm, Patient Position: Sitting)   Pulse 78   Ht 5\' 9"  (1.753 m)   Wt 190 lb (86.2 kg)   SpO2 97%   BMI 28.06 kg/m     Wt Readings from Last 3 Encounters:  12/07/22 190 lb (86.2 kg)     GEN:  Well nourished, well developed in no acute distress HEENT: Normal NECK: No JVD; No carotid bruits LYMPHATICS: No lymphadenopathy CARDIAC: RRR, no murmurs, no rubs, no gallops RESPIRATORY:  Clear to auscultation without rales, wheezing or rhonchi  ABDOMEN: Soft, non-tender, non-distended MUSCULOSKELETAL:  No edema; No deformity  SKIN: Warm and dry LOWER EXTREMITIES: no swelling NEUROLOGIC:  Alert and oriented x 3 PSYCHIATRIC:  Normal affect   ASSESSMENT:    1. NSTEMI (non-ST elevated myocardial infarction) (HCC)   2. Type 1 diabetes mellitus with diabetic polyneuropathy (HCC)   3. Insulin long-term use (HCC)   4. Mixed hyperlipidemia    PLAN:    In order of problems listed above:  History of non-STEMI which I think is related to his DKA, he definitely can benefit from antiplatelet therapy I put him on 81 mg aspirin every single day, I will schedule him to have a stress test to make sure we do not have residual coronary artery disease. Diabetes as per endocrinologist type II.  Is being follow-up again by endocrinology however his sugar is still poorly controlled.  I asked him to contact his endocrinologist to see if anything can be done to improve control of his diabetes. Dyslipidemia.  He is does have diabetes he should be on moderate statin, I put him back on Lipitor 20 mg daily   Medication Adjustments/Labs and Tests Ordered: Current medicines are reviewed at length with the patient today.  Concerns regarding medicines are outlined above.  No orders of the defined types were placed in  this encounter.  Medication changes: No orders of the defined types were placed in this encounter.   Signed, Georgeanna Lea, MD, [facility] 12/07/2022 1:45 PM    [facility]

## 2022-12-08 ENCOUNTER — Telehealth (HOSPITAL_COMMUNITY): Payer: Self-pay | Admitting: *Deleted

## 2022-12-08 NOTE — Telephone Encounter (Signed)
Left a detailed voice message  with instructions per DPR about his scheduled STRESS TEST on 12/15/22 at 8:00.

## 2022-12-14 NOTE — Addendum Note (Signed)
Addended by: Gypsy Balsam on: 12/14/2022 09:17 AM   Modules accepted: Orders

## 2022-12-15 ENCOUNTER — Ambulatory Visit: Payer: BC Managed Care – PPO | Attending: Cardiology

## 2022-12-15 VITALS — Ht 68.0 in | Wt 186.0 lb

## 2022-12-15 DIAGNOSIS — Z794 Long term (current) use of insulin: Secondary | ICD-10-CM | POA: Diagnosis not present

## 2022-12-15 DIAGNOSIS — I214 Non-ST elevation (NSTEMI) myocardial infarction: Secondary | ICD-10-CM | POA: Diagnosis not present

## 2022-12-15 DIAGNOSIS — E1042 Type 1 diabetes mellitus with diabetic polyneuropathy: Secondary | ICD-10-CM | POA: Diagnosis not present

## 2022-12-15 DIAGNOSIS — E782 Mixed hyperlipidemia: Secondary | ICD-10-CM | POA: Diagnosis not present

## 2022-12-15 LAB — MYOCARDIAL PERFUSION IMAGING
LV dias vol: 125 mL (ref 62–150)
LV sys vol: 52 mL
Nuc Stress EF: 59 %
Peak HR: 77 {beats}/min
Rest HR: 67 {beats}/min
Rest Nuclear Isotope Dose: 10.6 mCi
SDS: 5
SRS: 5
SSS: 10
ST Depression (mm): 0 mm
Stress Nuclear Isotope Dose: 28.5 mCi
TID: 1.18

## 2022-12-15 MED ORDER — REGADENOSON 0.4 MG/5ML IV SOLN
0.4000 mg | Freq: Once | INTRAVENOUS | Status: AC
Start: 2022-12-15 — End: 2022-12-15
  Administered 2022-12-15: 0.4 mg via INTRAVENOUS

## 2022-12-15 MED ORDER — TECHNETIUM TC 99M TETROFOSMIN IV KIT
10.6000 | PACK | Freq: Once | INTRAVENOUS | Status: AC | PRN
  Administered 2022-12-15: 10.6 via INTRAVENOUS

## 2022-12-15 MED ORDER — TECHNETIUM TC 99M TETROFOSMIN IV KIT
28.5000 | PACK | Freq: Once | INTRAVENOUS | Status: AC | PRN
  Administered 2022-12-15: 28.5 via INTRAVENOUS

## 2022-12-23 ENCOUNTER — Telehealth: Payer: Self-pay

## 2022-12-23 NOTE — Telephone Encounter (Signed)
Stres Test Results reviewed with pt as per Dr. Vanetta Shawl note.  Pt verbalized understanding and had no additional questions. Routed to PCP

## 2023-02-23 ENCOUNTER — Emergency Department (HOSPITAL_COMMUNITY): Payer: Medicaid Other

## 2023-02-23 ENCOUNTER — Encounter (HOSPITAL_COMMUNITY): Payer: Self-pay

## 2023-02-23 ENCOUNTER — Emergency Department (HOSPITAL_COMMUNITY)
Admission: EM | Admit: 2023-02-23 | Discharge: 2023-02-23 | Disposition: A | Discharge status: Home / Self Care | Payer: Medicaid Other | Attending: Emergency Medicine | Admitting: Emergency Medicine

## 2023-02-23 ENCOUNTER — Other Ambulatory Visit: Payer: Self-pay

## 2023-02-23 DIAGNOSIS — H532 Diplopia: Secondary | ICD-10-CM | POA: Insufficient documentation

## 2023-02-23 DIAGNOSIS — Z794 Long term (current) use of insulin: Secondary | ICD-10-CM | POA: Insufficient documentation

## 2023-02-23 DIAGNOSIS — Z7982 Long term (current) use of aspirin: Secondary | ICD-10-CM | POA: Insufficient documentation

## 2023-02-23 DIAGNOSIS — E11311 Type 2 diabetes mellitus with unspecified diabetic retinopathy with macular edema: Secondary | ICD-10-CM | POA: Insufficient documentation

## 2023-02-23 LAB — BASIC METABOLIC PANEL
Anion gap: 7 (ref 5–15)
BUN: 16 mg/dL (ref 6–20)
CO2: 29 mmol/L (ref 22–32)
Calcium: 8.9 mg/dL (ref 8.9–10.3)
Chloride: 101 mmol/L (ref 98–111)
Creatinine, Ser: 1 mg/dL (ref 0.61–1.24)
GFR, Estimated: >60 mL/min (ref >60)
Glucose, Bld: 235 mg/dL — ABNORMAL HIGH (ref 70–99)
Potassium: 4.3 mmol/L (ref 3.5–5.1)
Sodium: 137 mmol/L (ref 135–145)

## 2023-02-23 LAB — CBC
HCT: 46 % (ref 39.0–52.0)
Hemoglobin: 14.9 g/dL (ref 13.0–17.0)
MCH: 30.2 pg (ref 26.0–34.0)
MCHC: 32.4 g/dL (ref 30.0–36.0)
MCV: 93.3 fL (ref 80.0–100.0)
Platelets: 143 10*3/uL — ABNORMAL LOW (ref 150–400)
RBC: 4.93 MIL/uL (ref 4.22–5.81)
RDW: 12.7 % (ref 11.5–15.5)
WBC: 4.8 10*3/uL (ref 4.0–10.5)
nRBC: 0 % (ref 0.0–0.2)

## 2023-02-23 MED ORDER — IOHEXOL 350 MG/ML SOLN
75.0000 mL | Freq: Once | INTRAVENOUS | Status: AC | PRN
  Administered 2023-02-23: 75 mL via INTRAVENOUS

## 2023-02-23 NOTE — ED Triage Notes (Signed)
Pt recently seen by ophthalmologist for double vision, and trouble opening his left eye. Pt was referred to the ER regarding concerns for possible PCOM aneurysm.

## 2023-02-23 NOTE — Discharge Instructions (Signed)
You were seen in the ER for double vision and to rule out a brain aneurysm.  As we discussed, your CT scan today showed no evidence of an aneurysm. Please follow up with the eye doctor regarding your symptoms.   Continue to monitor how you're doing and return to the ER for new or worsening symptoms.

## 2023-02-23 NOTE — ED Provider Notes (Signed)
[place] EMERGENCY DEPARTMENT AT [facility] Provider Note   CSN: [id number] Arrival date & time: 02/23/23  1111     History  Chief Complaint  Patient presents with   Eye Problem    Kenneth Herrera is a 57 y.o. male with history of diabetic retinopathy with macular edema, cataracts, NSTEMI, depression, alcohol use disorder, HLD.    Patient complains of double vision x 3 months. Having difficulty keeping the left eye open. Associated headaches. Eye doctor was concerned for "microvascular CN III palsy, less likely PCOM aneurysm" but sent here for CT's to rule out aneurysm.    Eye Problem      Home Medications Prior to Admission medications   Medication Sig Start Date End Date Taking? Authorizing Provider  aspirin EC 81 MG tablet Take 1 tablet (81 mg total) by mouth daily. Swallow whole. 12/07/22   Georgeanna Lea, MD  atorvastatin (LIPITOR) 20 MG tablet Take 1 tablet (20 mg total) by mouth daily. 12/07/22   Georgeanna Lea, MD  insulin lispro (HUMALOG) 100 UNIT/ML injection Inject 1 Units into the skin 3 (three) times daily before meals.    [provider]  OXYGEN Inhale 4 L into the lungs daily.    [provider]      Allergies    Patient has no known allergies.    Review of Systems   Review of Systems  Eyes:  Positive for visual disturbance.  All other systems reviewed and are negative.   Physical Exam Updated Vital Signs BP (!) 167/94 (BP Location: Left Arm)   Pulse 60   Temp 97.6 F (36.4 C) (Oral)   Resp 17   Ht 5\' 8"  (1.727 m)   Wt 81.6 kg   SpO2 100%   BMI 27.37 kg/m  Physical Exam Vitals and nursing note reviewed.  Constitutional:      Appearance: Normal appearance.  HENT:     Head: Normocephalic and atraumatic.  Eyes:     General: Lids are normal.     Conjunctiva/sclera: Conjunctivae normal.     Comments: Pupils do not converge correctly, but do respond to light appropriately   Pulmonary:     Effort:  Pulmonary effort is normal. No respiratory distress.  Skin:    General: Skin is warm and dry.  Neurological:     Mental Status: He is alert.  Psychiatric:        Mood and Affect: Mood normal.        Behavior: Behavior normal.    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 Rule out aneurysm. EXAM: CT ANGIOGRAPHY HEAD AND NECK WITH AND WITHOUT CONTRAST TECHNIQUE: Multidetector CT imaging of the head and neck was performed using the standard protocol during bolus administration of intravenous contrast. Multiplanar CT image reconstructions and MIPs were obtained to evaluate the vascular anatomy. Carotid stenosis measurements (when applicable) are obtained  utilizing NASCET criteria, using the distal internal carotid diameter as the denominator. RADIATION DOSE REDUCTION: This exam was performed according to the departmental dose-optimization program which includes automated exposure control, adjustment of the mA and/or kV according to patient size and/or use of iterative reconstruction technique. CONTRAST:  75mL OMNIPAQUE IOHEXOL 350 MG/ML SOLN COMPARISON:  Brain MRI 12/16/2022 FINDINGS: CT HEAD FINDINGS Brain: No evidence of acute infarction, hemorrhage, hydrocephalus, extra-axial collection or mass lesion/mass effect. Vascular: No hyperdense vessel or unexpected calcification. Skull: Normal. Negative for fracture or focal lesion. Sinuses/Orbits: No acute finding. Review of the MIP  images confirms the above findings CTA NECK FINDINGS Aortic arch: Normal with 3 vessel branching Right carotid system: No stenosis, beading, or significant atherosclerosis. Left carotid system: No stenosis, beading, or significant atherosclerosis. Vertebral arteries: No stenosis, beading, or significant atherosclerosis. Skeleton: Severe dental caries.  No acute osseous finding Other neck: Negative Upper chest: Negative Review of the MIP images confirms the above findings CTA HEAD FINDINGS Anterior circulation: No significant stenosis, proximal occlusion, aneurysm, or vascular malformation. Posterior circulation: No significant stenosis, proximal occlusion, aneurysm, or vascular malformation. Venous sinuses: Unremarkable for an arterial study. Fairly symmetric enhancement of the cavernous sinuses. Anatomic variants: None Review of the MIP images confirms the above findings IMPRESSION: Negative for cerebral aneurysm or other explanation for cranial neuropathy. Electronically Signed   By: Tiburcio Pea M.D.   On: 02/23/2023 14:04    Procedures Procedures    Medications Ordered in ED Medications  iohexol (OMNIPAQUE) 350 MG/ML injection 75 mL (75 mLs Intravenous Contrast Given 02/23/23 1344)    ED Course/ Medical Decision Making/ A&P                                 Medical Decision Making Amount and/or Complexity of Data Reviewed Labs: ordered. Radiology: ordered.   Patient is a 57 year old male with history of diabetic retinopathy with macular edema, cataracts, NSTEMI, depression, alcohol use disorder, HLD who was sent to ER by request of ophthalmology with complaint of diplopia x 3 months.  Ophthalmologist was concerned for a cranial nerve III palsy causing diplopia, and convergence insufficiency. Sent to ER for CT's to rule out possible aneurysm as cause for symptoms.  CT angio head/neck show no evidence of aneurysm.   Symptoms appear chronic, have not changed in 3 months or so. No emergent  need for ophthalmology consultation at this time, or further evaluation from an inpatient standpoint. Will discharge to home. Has ophthalmology follow up scheduled on 9/5. Patient discharged in stable condition and all questions answered.   Final Clinical Impression(s) / ED Diagnoses Final diagnoses:  Diplopia    Rx / DC Orders ED Discharge Orders     None      Portions of this report may have been transcribed using voice recognition software. Every effort was made to ensure accuracy; however, inadvertent computerized transcription errors may be present.    Jeanella Flattery 02/23/23 1831    Bethann Berkshire, MD 02/25/23 1156

## 2023-02-23 NOTE — ED Provider Triage Note (Signed)
Emergency Medicine Provider Triage Evaluation Note  Kenneth Herrera , a 57 y.o. male  was evaluated in triage.  Pt complains of double vision x 3 months. Having difficulty keeping the left eye open. Associated headaches. Eye doctor was concerned for microvascular Cn III palsy, less likely PCOM aneursym but sent here for rule out.   Hx diabetic retinopathy with macular edema, cataracts  Review of Systems  Positive: As above Negative: Eye pain  Physical Exam  BP (!) 177/90 (BP Location: Left Arm)   Pulse 73   Temp 98.5 F (36.9 C) (Oral)   Resp 16   Ht 5\' 8"  (1.727 m)   Wt 81.6 kg   SpO2 99%   BMI 27.37 kg/m  Gen:   Awake, no distress   Resp:  Normal effort  MSK:   Moves extremities without difficulty  Other:    Medical Decision Making  Medically screening exam initiated at 12:12 PM.  Appropriate orders placed.  Benajmin Dunham was informed that the remainder of the evaluation will be completed by another provider, this initial triage assessment does not replace that evaluation, and the importance of remaining in the ED until their evaluation is complete.  Optho records recommend CT imaging. Will order basic labs, CTA head and neck   Niyanna Asch T, PA-C 02/23/23 1223

## 2023-04-05 DIAGNOSIS — I10 Essential (primary) hypertension: Secondary | ICD-10-CM | POA: Insufficient documentation

## 2023-07-03 DIAGNOSIS — Z7409 Other reduced mobility: Secondary | ICD-10-CM | POA: Insufficient documentation

## 2023-08-07 ENCOUNTER — Ambulatory Visit: Payer: Medicaid Other | Attending: Cardiology | Admitting: Cardiology

## 2023-08-07 ENCOUNTER — Encounter: Payer: Self-pay | Admitting: Cardiology

## 2023-08-07 VITALS — BP 136/88 | HR 0 | Ht 68.0 in | Wt 194.4 lb

## 2023-08-07 DIAGNOSIS — I10 Essential (primary) hypertension: Secondary | ICD-10-CM | POA: Diagnosis not present

## 2023-08-07 DIAGNOSIS — E1042 Type 1 diabetes mellitus with diabetic polyneuropathy: Secondary | ICD-10-CM

## 2023-08-07 DIAGNOSIS — M79662 Pain in left lower leg: Secondary | ICD-10-CM

## 2023-08-07 DIAGNOSIS — E782 Mixed hyperlipidemia: Secondary | ICD-10-CM | POA: Diagnosis not present

## 2023-08-07 DIAGNOSIS — I779 Disorder of arteries and arterioles, unspecified: Secondary | ICD-10-CM

## 2023-08-07 DIAGNOSIS — M79661 Pain in right lower leg: Secondary | ICD-10-CM

## 2023-08-07 NOTE — Addendum Note (Signed)
 Addended by: Baldo Ash D on: 08/07/2023 03:53 PM   Modules accepted: Orders

## 2023-08-07 NOTE — Progress Notes (Signed)
 Cardiology Office Note:    Date:  08/07/2023   ID:  Kenneth Herrera, DOB [birth year], MRN [id number]  PCP:  Ofilia Lamar CROME, MD  Cardiologist:  Lamar Fitch, MD    Referring MD: Ofilia Lamar CROME, MD   Chief Complaint  Patient presents with   Follow-up    History of Present Illness:    Kenneth Herrera is a 57 y.o. male past medical history significant for type 1 diabetes poorly controlled now getting better he does have an insulin  pump as well as CGM, multiple admission with DKA to the hospital.  Last time he was in the hospital his troponin were positive and he was referred to us  stress test done negative.  His ejection fraction was preserved.  We were thinking there was acute coronary syndrome type II secondary to DKA.  Comes today to months for follow-up since have seen him last time he get another amputation of the little toe on his lower extremity.  He does have significant neuropathy.  Denies have any chest pain tightness squeezing pressure burning chest overall doing fair.  Past Medical History:  Diagnosis Date   Acute gastric ulcer with hemorrhage 09/13/2021   Formatting of this note might be different from the original. 08/2021: hosp, EGD, PPI Formatting of this note might be different from the original. 08/2021: hosp, EGD, PPI   Acute hypoxemic respiratory failure (HCC) 11/08/2022   Formatting of this note might be different from the original. 10/2022: multilobar CAP   Aftercare following surgery 02/07/2022   Alcohol use disorder, severe, dependence (HCC) 11/28/2018   Bilious vomiting with nausea 04/06/2020   Formatting of this note might be different from the original. 04/06/2020 Formatting of this note might be different from the original. 04/06/2020   Carbuncle 10/12/2021   Formatting of this note might be different from the original. 10/12/2021 : occiput x 3 Formatting of this note might be different from the original. 10/12/2021 : occiput x 3   Cellulitis 01/24/2022    Formatting of this note might be different from the original. 01/24/2022 Formatting of this note might be different from the original. 01/24/2022   Cervical radiculopathy 10/02/2019   Formatting of this note might be different from the original. 2021: RUE, non-specific Formatting of this note might be different from the original. 2021: RUE, non-specific   Community acquired pneumonia 11/08/2022   Formatting of this note might be different from the original. 10/2022: resp failure, adm. multilobar   Diabetes mellitus without complication (HCC)    Diabetic ketoacidosis without coma associated with type 2 diabetes mellitus (HCC) 11/08/2022   Formatting of this note might be different from the original. 10/2022: CBG 900, with CAP   Diabetic ulcer of toe of right foot associated with type 2 diabetes mellitus, with fat layer exposed (HCC) 01/24/2022   Formatting of this note might be different from the original. 01/24/2022: right 2nd toe Formatting of this note might be different from the original. 01/24/2022: right 2nd toe   Diarrhea of presumed infectious origin 04/07/2021   Formatting of this note might be different from the original. 04/07/2021: Formatting of this note might be different from the original. 04/07/2021:   Erectile dysfunction 01/14/2016   Esophageal candidiasis (HCC) 09/13/2021   Formatting of this note might be different from the original. 08/2021: hosp odynophagia, DKA, GIB with EGD Formatting of this note might be different from the original. 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 10/2022: DKA, CAP with ARF   Other deformities of toe(s) (acquired), left foot 06/23/2022   Pre-ulcerative calluses 08/16/2022   Prostatitis, acute 11/26/2021   Formatting of this note might be different from the original. 11/26/2021 Formatting of this note might be different from the original. 11/26/2021   Recurrent major depressive disorder, in full remission (HCC) 09/22/2015   S/P amputation of lesser toe, left (HCC) 02/14/2022   Tubular adenoma of colon 09/08/2021   Formatting of this note might be different from the original. 09/08/2021 Formatting of this note might be different from the original. 09/08/2021   Type 1 diabetes mellitus with diabetic polyneuropathy (HCC) 10/18/2021   Wellness examination 03/23/2020    History reviewed. No pertinent surgical history.  Current Medications: Current Meds  Medication Sig   AIRSUPRA 90-80 MCG/ACT AERO Inhale 2 puffs into the lungs every 4 (four) hours as needed.   amLODipine (NORVASC) 5 MG tablet Take 5 mg by mouth daily.   aspirin  EC 81 MG tablet Take 1 tablet (81 mg total) by mouth daily. Swallow whole.   atorvastatin  (LIPITOR) 20 MG tablet Take 1 tablet (20 mg total) by mouth daily.   DULoxetine (CYMBALTA) 60 MG capsule Take 60 mg by mouth daily.   insulin  lispro (HUMALOG) 100 UNIT/ML injection Inject 1 Units into the skin 3 (three) times daily before meals.   LANTUS  SOLOSTAR 100 UNIT/ML Solostar Pen Inject 32 Units into the skin as directed. Only takes if he runs out of Humalog   [DISCONTINUED] OXYGEN Inhale 4 L into the lungs daily.     Allergies:   Patient has no known allergies.   Social History   Socioeconomic History   Marital status: Single    Spouse name: Not on file   Number of children: Not on file   Years of education: Not on file   Highest education level: Not on file  Occupational History   Not on file  Tobacco Use   Smoking status: Former    Types: Cigarettes   Smokeless tobacco: Current  Substance and  Sexual Activity   Alcohol use: Not Currently   Drug use: Never   Sexual activity: Not Currently  Other Topics Concern   Not on file  Social History Narrative   ** Merged History Encounter **       Social Drivers of Health   Financial Resource Strain: Not on file  Food Insecurity: Low Risk  (07/06/2023)   Received from [facility]   Hunger Vital Sign    Worried About Running Out of Food in the Last Year: Never true    Ran Out of Food in the Last Year: Never true  Recent Concern: Food Insecurity - Medium Risk (06/06/2023)   Received from [facility]   Hunger Vital Sign    Worried About Running Out of Food in the Last Year: Sometimes true    Ran Out of Food in the Last Year: Sometimes true  Transportation Needs: Unmet Transportation Needs (07/06/2023)   Received from [facility]    In the past 12 months, has lack of reliable transportation kept you from  medical appointments, meetings, work or from getting things needed for daily living? : Yes  Physical Activity: Inactive (07/06/2023)   Received from [facility]   Exercise Vital Sign    Days of Exercise per Week: 0 days    Minutes of Exercise per Session: 0 min  Stress: Not on file  Social Connections: Not on file     Family History: The patient's family history is not on file. ROS:   Please see the history of present illness.    All 14 point review of systems negative except as described per history of present illness  EKGs/Labs/Other Studies Reviewed:         Recent Labs: 02/23/2023: BUN 16; Creatinine, Ser 1.00; Hemoglobin 14.9; Platelets 143; Potassium 4.3; Sodium 137  Recent Lipid Panel No results found for: CHOL, TRIG, HDL, CHOLHDL, VLDL, LDLCALC, LDLDIRECT  Physical Exam:    VS:  BP 136/88 (BP Location: Left Arm, Patient Position: Sitting)   Pulse (!) 0   Ht 5' 8 (1.727 m)   Wt 194 lb 6.4 oz (88.2 kg)   SpO2 98%   BMI 29.56 kg/m     Wt Readings from Last 3  Encounters:  08/07/23 194 lb 6.4 oz (88.2 kg)  02/23/23 180 lb (81.6 kg)  12/15/22 186 lb (84.4 kg)     GEN:  Well nourished, well developed in no acute distress HEENT: Normal NECK: No JVD; No carotid bruits LYMPHATICS: No lymphadenopathy CARDIAC: RRR, no murmurs, no rubs, no gallops RESPIRATORY:  Clear to auscultation without rales, wheezing or rhonchi  ABDOMEN: Soft, non-tender, non-distended MUSCULOSKELETAL:  No edema; No deformity  SKIN: Warm and dry LOWER EXTREMITIES: no swelling NEUROLOGIC:  Alert and oriented x 3 PSYCHIATRIC:  Normal affect   ASSESSMENT:    1. Primary hypertension   2. Type 1 diabetes mellitus with diabetic polyneuropathy (HCC)   3. Mixed hyperlipidemia    PLAN:    In order of problems listed above:  Type 1 diabetes poorly controlled but now much better.  Followed by endocrinology. Essential hypertension blood pressure well-controlled continue present management. Dyslipidemia I did review his lab work test done by primary care physician on December 16 which show HDL 41 LDL 55 decent control.  Will continue present management.   Medication Adjustments/Labs and Tests Ordered: Current medicines are reviewed at length with the patient today.  Concerns regarding medicines are outlined above.  No orders of the defined types were placed in this encounter.  Medication changes: No orders of the defined types were placed in this encounter.   Signed, Lamar DOROTHA Fitch, MD, [facility] 08/07/2023 3:44 PM    [facility]

## 2023-08-07 NOTE — Patient Instructions (Addendum)
 Medication Instructions:  Your physician recommends that you continue on your current medications as directed. Please refer to the Current Medication list given to you today.  *If you need a refill on your cardiac medications before your next appointment, please call your pharmacy*   Lab Work: None Ordered If you have labs (blood work) drawn today and your tests are completely normal, you will receive your results only by: MyChart Message (if you have MyChart) OR A paper copy in the mail If you have any lab test that is abnormal or we need to change your treatment, we will call you to review the results.   Testing/Procedures: Your physician has requested that you have a carotid duplex. This test is an ultrasound of the carotid arteries in your neck. It looks at blood flow through these arteries that supply the brain with blood. Allow one hour for this exam. There are no restrictions or special instructions.   Your physician has requested that you have a lower  extremity arterial duplex. This test is an ultrasound of the arteries in the legs  It looks at arterial blood flow in the legs. Allow one hour for Lower Arterial scans. There are no restrictions or special instructions.  Please note: We ask at that you not bring children with you during ultrasound (echo/ vascular) testing. Due to room size and safety concerns, children are not allowed in the ultrasound rooms during exams. Our front office staff cannot provide observation of children in our lobby area while testing is being conducted. An adult accompanying a patient to their appointment will only be allowed in the ultrasound room at the discretion of the ultrasound technician under special circumstances. We apologize for any inconvenience.    Follow-Up: At [facility], you and your health needs are our priority.  As part of our continuing mission to provide you with exceptional heart care, we have created designated Provider Care  Teams.  These Care Teams include your primary Cardiologist (physician) and Advanced Practice Providers (APPs -  Physician Assistants and Nurse Practitioners) who all work together to provide you with the care you need, when you need it.  We recommend signing up for the patient portal called MyChart.  Sign up information is provided on this After Visit Summary.  MyChart is used to connect with patients for Virtual Visits (Telemedicine).  Patients are able to view lab/test results, encounter notes, upcoming appointments, etc.  Non-urgent messages can be sent to your provider as well.   To learn more about what you can do with MyChart, go to [email or web address].    Your next appointment:   6 month(s)  The format for your next appointment:   In Person  Provider:   Lamar Fitch, MD    Other Instructions NA

## 2023-09-26 ENCOUNTER — Ambulatory Visit: Payer: Medicaid Other | Attending: Cardiology

## 2023-09-26 ENCOUNTER — Ambulatory Visit (INDEPENDENT_AMBULATORY_CARE_PROVIDER_SITE_OTHER): Payer: Medicaid Other

## 2023-09-26 DIAGNOSIS — M79661 Pain in right lower leg: Secondary | ICD-10-CM

## 2023-09-26 DIAGNOSIS — I779 Disorder of arteries and arterioles, unspecified: Secondary | ICD-10-CM | POA: Diagnosis not present

## 2023-09-26 DIAGNOSIS — M79662 Pain in left lower leg: Secondary | ICD-10-CM

## 2023-09-26 DIAGNOSIS — I6523 Occlusion and stenosis of bilateral carotid arteries: Secondary | ICD-10-CM

## 2023-09-26 LAB — VAS US ABI WITH/WO TBI
Left ABI: 1.23
Right ABI: 1.12

## 2023-09-28 ENCOUNTER — Telehealth: Payer: Self-pay

## 2023-09-28 NOTE — Telephone Encounter (Signed)
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  He is already on aspirin, cholesterol is well-controlled.  Dr. Bing Matter might repeat imaging in a year or 2 for surveillance.  Able result.

## 2023-09-28 NOTE — Telephone Encounter (Signed)
 LM to return my call.

## 2023-09-28 NOTE — Telephone Encounter (Signed)
-----   Message from Flossie Dibble sent at 09/26/2023  5:11 PM EST ----- Lower extremity Doppler study reveals normal blood flow.  This is a reassuring study.

## 2023-09-29 ENCOUNTER — Telehealth: Payer: Self-pay

## 2023-09-29 NOTE — Telephone Encounter (Signed)
 [place] Results reviewed with pt as per [facility] note.  Pt verbalized understanding and had no additional questions. Routed to PCP

## 2023-09-29 NOTE — Telephone Encounter (Signed)
 ABI Results reviewed with pt as per [facility] note.  Pt verbalized understanding and had no additional questions. Routed to PCP

## 2023-10-06 ENCOUNTER — Encounter (HOSPITAL_COMMUNITY): Payer: Self-pay
